# Patient Record
Sex: Male | Born: 2000 | Race: White | Hispanic: No | Marital: Single | Smoking: Current every day smoker
Health system: Southern US, Community
[De-identification: ages and names within clinical notes are randomized; demographics above are authoritative.]

## PROBLEM LIST (undated history)

## (undated) DIAGNOSIS — F32A Depression, unspecified: Secondary | ICD-10-CM

## (undated) DIAGNOSIS — F419 Anxiety disorder, unspecified: Secondary | ICD-10-CM

## (undated) DIAGNOSIS — F329 Major depressive disorder, single episode, unspecified: Secondary | ICD-10-CM

## (undated) DIAGNOSIS — F988 Other specified behavioral and emotional disorders with onset usually occurring in childhood and adolescence: Secondary | ICD-10-CM

## (undated) DIAGNOSIS — L0291 Cutaneous abscess, unspecified: Secondary | ICD-10-CM

## (undated) DIAGNOSIS — Z8489 Family history of other specified conditions: Secondary | ICD-10-CM

## (undated) HISTORY — PX: NO PAST SURGERIES: SHX2092

## (undated) HISTORY — DX: Cutaneous abscess, unspecified: L02.91

## (undated) HISTORY — DX: Anxiety disorder, unspecified: F41.9

## (undated) HISTORY — PX: MOUTH SURGERY: SHX715

## (undated) HISTORY — DX: Other specified behavioral and emotional disorders with onset usually occurring in childhood and adolescence: F98.8

## (undated) HISTORY — PX: TYMPANOSTOMY TUBE PLACEMENT: SHX32

## (undated) HISTORY — DX: Major depressive disorder, single episode, unspecified: F32.9

## (undated) HISTORY — DX: Depression, unspecified: F32.A

---
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
## 2019-01-19 ENCOUNTER — Ambulatory Visit (HOSPITAL_COMMUNITY): Payer: 59 | Admitting: Psychiatry

## 2019-01-19 ENCOUNTER — Other Ambulatory Visit: Payer: Self-pay

## 2019-01-25 ENCOUNTER — Telehealth (HOSPITAL_COMMUNITY): Payer: Self-pay

## 2019-01-25 NOTE — Telephone Encounter (Signed)
Patient called with parents on the phone, he is thinking that his Prozac may need to be increased. Patient is having some anxiety, he is taking the Vistaril on a more regular basis and his parents want to know if increasing the Prozac may help as well.

## 2019-01-26 NOTE — Telephone Encounter (Signed)
I tried calling Essa but there was no asnwer and I could not leave a voice message. Try to schedule him an appointment with me in the next week or 2.

## 2019-02-02 ENCOUNTER — Other Ambulatory Visit: Payer: Self-pay

## 2019-02-02 ENCOUNTER — Encounter (HOSPITAL_COMMUNITY): Payer: Self-pay | Admitting: Psychiatry

## 2019-02-02 ENCOUNTER — Ambulatory Visit (INDEPENDENT_AMBULATORY_CARE_PROVIDER_SITE_OTHER): Payer: 59 | Admitting: Psychiatry

## 2019-02-02 DIAGNOSIS — F411 Generalized anxiety disorder: Secondary | ICD-10-CM

## 2019-02-02 DIAGNOSIS — F324 Major depressive disorder, single episode, in partial remission: Secondary | ICD-10-CM | POA: Diagnosis not present

## 2019-02-02 MED ORDER — FLUOXETINE HCL 40 MG PO CAPS: 40.0000 mg | ORAL_CAPSULE | Freq: Every day | ORAL | 1 refills | Status: DC

## 2019-02-02 MED ORDER — HYDROXYZINE HCL 10 MG PO TABS: 20.0000 mg | ORAL_TABLET | Freq: Three times a day (TID) | ORAL | 1 refills | Status: DC | PRN

## 2019-02-02 NOTE — Progress Notes (Signed)
Virtual Visit via Video Note  I connected with Ian Lamb on 02/02/19 at  1:45 PM EDT by a video enabled telemedicine application and verified that I am speaking with the correct person using two identifiers.  Location: Patient: home Provider: office   I discussed the limitations of evaluation and management by telemedicine and the availability of in person appointments. The patient expressed understanding and agreed to proceed.  History of Present Illness: "I have been stressed out". He has had 2 relatives pass recently. He has been more angry and irritable lately. He can hold it in sometimes but other times he will snap at people. He is depressed and anxious. Ian Lamb describes a lot of headaches and other physical symptoms. The depression will come on for several days at a time, thru out the month. He will smoke marijuana at night to sleep. He stopped for the last 3 days but is not sleeping well. Luman is taking Vistaril TID and it helps with his anxiety a little. Vistaril no longer seems as effective. Iver feels guilty about things he shouldn't. He is crying a lot over the death of her his grandparents several times a week. Ian Lamb denies SI/HI. He does have some passive thoughts of death. Ian Lamb states he would never kill himself because he does not to hurt anyone.   The Prozac works but seems to wear off by noon. He wants to increase the dose and hopes it will last all day. He is a lot more relaxed and it gives him relief and makes him feel more secure and more like himself when he takes the Prozac.    Observations/Objective:  There were no vitals taken for this visit.There is no height or weight on file to calculate BMI.  General Appearance: Casual and Neat  Eye Contact:  Fair  Speech:  Clear and Coherent and Normal Rate  Volume:  Normal  Mood:  Anxious and Depressed  Affect:  Congruent  Thought Process:  Goal Directed and Descriptions of Associations: Intact  Orientation:  Full (Time,  Place, and Person)  Thought Content:  Logical  Suicidal Thoughts:  No  Homicidal Thoughts:  No  Memory:  Immediate;   Good Recent;   Good  Judgement:  Fair  Insight:  Fair  Psychomotor Activity:  Normal  Concentration:  Concentration: Fair  Recall:  Ian Lamb of Knowledge:  Good  Language:  Good  Akathisia:  No  Handed:  Right  AIMS (if indicated):     Assets:  Communication Skills Desire for Improvement Financial Resources/Insurance Housing Leisure Time [place] Talents/Skills Transportation Vocational/Educational  ADL's:  Intact  Cognition:  WNL  Sleep:        Assessment and Plan: MDD- recurrent, moderate; GAD; Cannabis abuse; ADHD- inattentive type   Increase Prozac 40mg  po qD  Increase Vistaril 20mg  po TID prn anxiety  Follow Up Instructions: In 6-8 weeks or sooner if needed   I discussed the assessment and treatment plan with the patient. The patient was provided an opportunity to ask questions and all were answered. The patient agreed with the plan and demonstrated an understanding of the instructions.   The patient was advised to call back or seek an in-person evaluation if the symptoms worsen or if the condition fails to improve as anticipated.  I provided 20 minutes of non-face-to-face time during this encounter.   [name], MD

## 2019-03-16 ENCOUNTER — Encounter: Payer: Self-pay | Admitting: Family Medicine

## 2019-03-20 ENCOUNTER — Other Ambulatory Visit: Payer: Self-pay | Admitting: Family Medicine

## 2019-03-20 ENCOUNTER — Telehealth (HOSPITAL_COMMUNITY): Payer: Self-pay | Admitting: *Deleted

## 2019-03-20 DIAGNOSIS — F419 Anxiety disorder, unspecified: Secondary | ICD-10-CM

## 2019-03-20 NOTE — Telephone Encounter (Signed)
Pt mother called regarding increasing Prozac due to increased anger and anxiety. Please review and advise pt has an appointment on 03/29/21.

## 2019-03-23 ENCOUNTER — Telehealth: Payer: Self-pay

## 2019-03-23 NOTE — Telephone Encounter (Signed)
Patient's mom, Ian Lamb, left message on triage line stating that patient needs to get a new counselor and psychiatrist that work close together. She is aware that patient sent Dr Damita Dunnings a message a few days ago about this (referral was put in already) [name] states patient has been seen [name] for 2 1/2 year and [name], psychiatrist. Patient does not have a good relationship with the psychiatrist. Mom states patient has had 2 deaths in the past month to deal with and having anxiety. He is taking his medication but not working as well. Mom just wanted to let us know.

## 2019-03-23 NOTE — Telephone Encounter (Signed)
Noted.  I'll route for input on the referral.  Rosaria Ferries- what are his options for referral?

## 2019-03-30 ENCOUNTER — Other Ambulatory Visit: Payer: Self-pay

## 2019-03-30 ENCOUNTER — Ambulatory Visit (INDEPENDENT_AMBULATORY_CARE_PROVIDER_SITE_OTHER): Payer: 59 | Admitting: Psychiatry

## 2019-03-30 ENCOUNTER — Encounter (HOSPITAL_COMMUNITY): Payer: Self-pay | Admitting: Psychiatry

## 2019-03-30 DIAGNOSIS — F324 Major depressive disorder, single episode, in partial remission: Secondary | ICD-10-CM | POA: Diagnosis not present

## 2019-03-30 DIAGNOSIS — F411 Generalized anxiety disorder: Secondary | ICD-10-CM | POA: Diagnosis not present

## 2019-03-30 DIAGNOSIS — F1210 Cannabis abuse, uncomplicated: Secondary | ICD-10-CM | POA: Diagnosis not present

## 2019-03-30 DIAGNOSIS — F900 Attention-deficit hyperactivity disorder, predominantly inattentive type: Secondary | ICD-10-CM

## 2019-03-30 MED ORDER — FLUOXETINE HCL 20 MG PO CAPS: 60.0000 mg | ORAL_CAPSULE | Freq: Every day | ORAL | 2 refills | Status: DC

## 2019-03-30 MED ORDER — CLONIDINE HCL 0.1 MG PO TABS: 0.1000 mg | ORAL_TABLET | Freq: Three times a day (TID) | ORAL | 2 refills | Status: DC | PRN

## 2019-03-30 NOTE — Progress Notes (Signed)
Virtual Visit via Telephone Note  I connected with Ian Lamb  on 03/30/19 at  2:00 PM EST by telephone and verified that I am speaking with the correct person using two identifiers.  Location: Patient: home Provider: office   I discussed the limitations, risks, security and privacy concerns of performing an evaluation and management service by telephone and the availability of in person appointments. I also discussed with the patient that there may be a patient responsible charge related to this service. The patient expressed understanding and agreed to proceed.   History of Present Illness: Pt states he is not able to share all his thoughts and feelings so he has asked his parents to join.  Mom states he has lost 2 grandmother's in the last 2 months who he was very close to. He is not eating well due to upset GI. This is typical for when he is anxious. He feels bad about himself. He is easily overwhelmed and very sensitive to other's feelings. He is being tested for ADD in late December. He has not taken any ADD meds since the 10th grade. Ian Lamb is often forgetful and misses meds. He thinks that medicinal marijuana. He has told his mom that marijuana is the only thing that can calm him down. Ian Lamb does not like to drink a lot. Ian Lamb is aware that he is depressed and does not like feeling this way. He is more anxious lately.  His dad shares that when Ian Lamb is upset he gets very angry. They are not able to communicate with him until he calms downs. His dad thinks that the lack of treatment of ADHD is contributing.   Ian Lamb thinks the Prozac is not very effective but later he states it does help and he has been taking it daily lately. When he gets upset he takes the Vistaril it helps for about an hour and then goes away. He tries to take it about 2-3 times a day. He often forgets to take it but would get upset and angry with himself because of it. Ian Lamb has been feeling more anxious lately. He has  a lot of issues with self confidence. On the inside he is always thinking bad thoughts about himself. When anxious he will stutter. Ian Lamb denies SI/HI. He does have thoughts about death but states he always feels bad after. He has no desire, plan or intent to hurt or kill himself. Sleep is fair for the last 2 days. Prior he was waking up at 3am and having a lot of anxiety. He is unable to go back to sleep. Ian Lamb tries to stay active to keep himself distracted. Prozac does not help with is anxiety. Ian Lamb does want to get treatment for ADHD now and has an appointment with a clinic at the end of December. He continues to smoke marijuana daily because it helps him calm down and sleep. He does it alone and only for sleeping.     Observations/Objective: I spoke with Ian Lamb [name] on the phone.  Pt was calm, pleasant and cooperative.  Pt was engaged in the conversation and answered questions appropriately.  Speech was clear and coherent with normal rate, tone and volume.  Mood is depressed and anxious, affect is congruent. He was on/off tearful. Thought processes are coherent, goal oriented and intact.  Thought content is logical.  Pt denies SI/HI.   Pt denies auditory and visual hallucinations and did not appear to be responding to internal stimuli.  Memory and  concentration are good.  Fund of knowledge and use of language are average.  Insight and judgment are fair.  I am unable to comment on psychomotor activity, general appearance, hygiene, or eye contact as I was unable to physically see the patient on the phone.  Vital signs not available since interview conducted virtually.     Assessment and Plan: MDD-recurrent, moderate; GAD; Cannabis abuse; ADHD-inattentive type  Status of current symptoms: worsening depression and anxiety  Increase Prozac 60mg  po qD  D/c Vistaril   Start trial of Clonidine 0.1mg  po TID prn anxiety   Encouraged to look treatment of ADHD  Follow Up Instructions: In 6 weeks or  sooner if needed   I discussed the assessment and treatment plan with the patient. The patient was provided an opportunity to ask questions and all were answered. The patient agreed with the plan and demonstrated an understanding of the instructions.   The patient was advised to call back or seek an in-person evaluation if the symptoms worsen or if the condition fails to improve as anticipated.  I provided 40 minutes of non-face-to-face time during this encounter.   Ian Cradle, MD

## 2019-04-24 ENCOUNTER — Encounter: Payer: Self-pay | Admitting: Family Medicine

## 2019-04-24 NOTE — Telephone Encounter (Signed)
pts mom said pt saw his father in [place] 8 days ago and pts father tested positive 04/23/18. Pt does not have covid symptoms and no travel. Scheduled virtual visit on 04/24/18 at 8:45. Will have vitals ready. UC & ED precautions given and pts mom voiced understanding. Pt mom does not have doctor and pts mom is not sure who pts stepfathers'doctor is but he will ck with his doctor for further instructions. FYI to Dr Para March.

## 2019-04-25 ENCOUNTER — Ambulatory Visit (INDEPENDENT_AMBULATORY_CARE_PROVIDER_SITE_OTHER): Payer: Managed Care, Other (non HMO) | Admitting: Family Medicine

## 2019-04-25 ENCOUNTER — Other Ambulatory Visit: Payer: Self-pay

## 2019-04-25 DIAGNOSIS — Z9189 Other specified personal risk factors, not elsewhere classified: Secondary | ICD-10-CM | POA: Diagnosis not present

## 2019-04-25 NOTE — Telephone Encounter (Signed)
See OV note.  Thanks.  

## 2019-04-25 NOTE — Progress Notes (Signed)
Interactive audio and video telecommunications were attempted between this provider and patient, however failed, due to patient having technical difficulties OR patient did not have access to video capability.  We continued and completed visit with audio only.   Virtual Visit via Telephone Note  I connected with patient on 04/25/19  at 8:55 AM  by telephone and verified that I am speaking with the correct person using two identifiers.  Location of patient: home  Location of MD: [facility] Name of referring provider (if blank then none associated): Names per persons and role in encounter:  MD: Ferd Hibbs, Patient: name listed above.    I discussed the limitations, risks, security and privacy concerns of performing an evaluation and management service by telephone and the availability of in person appointments. I also discussed with the patient that there may be a patient responsible charge related to this service. The patient expressed understanding and agreed to proceed.  CC: covid exposure.    History of Present Illness: Father recently tested positive after having a fever.  His father tested positive yesterday, with a same day test.  Father's sx started yesterday.  Pt's last exposure to his father was 9 days ago.    Pt has no sx now, no FCNAVD.  No taste or smell changes. He still feels well.     Observations/Objective: nad Speech wnl.    Assessment and Plan: he feels well, discussed options.  It may be the case where his father contracted covid after last exposure to patient.  Would defer testing at this point since patient is w/o sx and nearing the 14 day limit from last possible exposure and with 2-3 day testing turn-around time likely.  Pt to monitor sx for now and update me as needed.  Routine cautions d/w pt.  He agrees.    Follow Up Instructions: see above.    I discussed the assessment and treatment plan with the patient. The patient was provided an opportunity to ask  questions and all were answered. The patient agreed with the plan and demonstrated an understanding of the instructions.   The patient was advised to call back or seek an in-person evaluation if the symptoms worsen or if the condition fails to improve as anticipated.  I provided 20 minutes of non-face-to-face time during this encounter.   Crawford Givens, MD

## 2019-04-25 NOTE — Assessment & Plan Note (Signed)
He feels well, discussed options.  It may be the case where his father contracted covid after last exposure to patient.  Would defer testing at this point since patient is w/o sx and nearing the 14 day limit from last possible exposure and with 2-3 day testing turn-around time likely.  Pt to monitor sx for now and update me as needed.  Routine cautions d/w pt.  He agrees.

## 2019-05-04 ENCOUNTER — Ambulatory Visit (HOSPITAL_COMMUNITY): Payer: 59 | Admitting: Psychiatry

## 2019-05-18 ENCOUNTER — Ambulatory Visit (HOSPITAL_COMMUNITY): Payer: 59 | Admitting: Psychiatry

## 2019-05-18 ENCOUNTER — Other Ambulatory Visit: Payer: Self-pay

## 2019-05-18 ENCOUNTER — Encounter (HOSPITAL_COMMUNITY): Payer: Self-pay | Admitting: Psychiatry

## 2019-05-18 NOTE — Progress Notes (Unsigned)
  Virtual Visit via Telephone Note  I connected with Ian Lamb  on 05/18/19 at  3:30 PM EST by telephone and verified that I am speaking with the correct person using two identifiers.  Location: Patient: home Provider: office   I discussed the limitations, risks, security and privacy concerns of performing an evaluation and management service by telephone and the availability of in person appointments. I also discussed with the patient that there may be a patient responsible charge related to this service. The patient expressed understanding and agreed to proceed.   History of Present Illness: Ian Lamb asked that I speak with his mom first. Mom shared that the increase in Prozac and Clonidine really helped. He had a good month. Over the last 2 weeks he has had some bad days. Those days he thinks about his past mistakes and gets fixated on it. When he gets this anxious he feels he needs something help. They had an appointment with the ADHD specialist and want to give a good hx of his past med trials.  Ian Lamb states the last 2 weeks have been more difficult. He is not sleeping well. He will have racing thoughts and fall asleep takes many hours then he will only sleep for 2-3 hrs. He is napping during the day due to excess fatigue. The Clonidine is no longer effective. Ian Lamb is depressed and is having a lot of negative self talk. He feels alone even when with other people when he is stressed. Being around others does help a lot when he is just feeling down. Ian Lamb misses his deceased family a lot. Ian Lamb gets really mad at himself but denies SIB. He denies SI/HI.  He has a history of seasonal affective disorder. In the past being active and busy helps a lot.     Observations/Objective: I spoke with Ian Lamb on the phone.  Pt was calm, pleasant and cooperative.  Pt was engaged in the conversation and answered questions appropriately.  Speech was clear and coherent with normal rate, tone and  volume.  Mood is ***depressed and anxious, affect is congruent. Thought processes are coherent, goal oriented and intact.  Thought content is with ruminations***.  Pt denies SI/HI.   Pt denies auditory and visual hallucinations and did not appear to be responding to internal stimuli.  Memory and concentration are good.  Fund of knowledge and use of language are average.  Insight and judgment are fair.  I am unable to comment on psychomotor activity, general appearance, hygiene, or eye contact as I was unable to physically see the patient on the phone.  Vital signs not available since interview conducted virtually.     Assessment and Plan: MDD-recurrent, moderate; GAD; Cannabis abuse; ADHD- inattentive type  Status of current symptoms: worsening depression and anxiety  Increase Prozac 80mg  po qD  D/c Clonidine   Start trial of Propranolol 10mg  po BID prn anxiety  Follow Up Instructions: In 4 weeks or sooner if needed   I discussed the assessment and treatment plan with the patient. The patient was provided an opportunity to ask questions and all were answered. The patient agreed with the plan and demonstrated an understanding of the instructions.   The patient was advised to call back or seek an in-person evaluation if the symptoms worsen or if the condition fails to improve as anticipated.  I provided *** minutes of non-face-to-face time during this encounter.   , MD

## 2019-05-22 ENCOUNTER — Telehealth (HOSPITAL_COMMUNITY): Payer: Self-pay | Admitting: *Deleted

## 2019-05-22 NOTE — Telephone Encounter (Signed)
Writer received message from pt mother stating that pt has not been able to get his increased Prozac dose, 80mg , nor able to begin the Inderal as RX's have not been sent to pharmacy. I don't see increase dose or initiation of Inderal reflected in last visit note. Please review and advise.

## 2019-05-25 ENCOUNTER — Other Ambulatory Visit (HOSPITAL_COMMUNITY): Payer: Self-pay | Admitting: Psychiatry

## 2019-05-25 ENCOUNTER — Telehealth (HOSPITAL_COMMUNITY): Payer: Self-pay | Admitting: *Deleted

## 2019-05-25 DIAGNOSIS — F411 Generalized anxiety disorder: Secondary | ICD-10-CM

## 2019-05-25 DIAGNOSIS — F324 Major depressive disorder, single episode, in partial remission: Secondary | ICD-10-CM

## 2019-05-25 MED ORDER — PROPRANOLOL HCL 10 MG PO TABS: 10.0000 mg | ORAL_TABLET | Freq: Two times a day (BID) | ORAL | 2 refills | Status: DC | PRN

## 2019-05-25 MED ORDER — FLUOXETINE HCL 40 MG PO CAPS: 80.0000 mg | ORAL_CAPSULE | Freq: Every day | ORAL | 0 refills | Status: DC

## 2019-05-25 NOTE — Telephone Encounter (Signed)
Pt mother called second time requesting refill of pt med Prozac 20, 3 tabs qd. Last written 03/30/19. Pt has an upcoming appointment on 06/08/19. Please review.

## 2019-05-30 ENCOUNTER — Telehealth (HOSPITAL_COMMUNITY): Payer: Self-pay | Admitting: *Deleted

## 2019-05-30 ENCOUNTER — Encounter: Payer: Self-pay | Admitting: Family Medicine

## 2019-05-30 NOTE — Telephone Encounter (Signed)
Nurse received message from mother concerned that pt has been having nosebleeds which she feels is related to increasing Prozac to 80mg , and adding Inderal 10mg  bid. Looks like mother reached to PCP also. She is concerned and frustrated regarding medication regime. Please review.

## 2019-06-08 ENCOUNTER — Encounter (HOSPITAL_COMMUNITY): Payer: Self-pay | Admitting: Psychiatry

## 2019-06-08 ENCOUNTER — Other Ambulatory Visit: Payer: Self-pay

## 2019-06-08 ENCOUNTER — Ambulatory Visit (INDEPENDENT_AMBULATORY_CARE_PROVIDER_SITE_OTHER): Payer: 59 | Admitting: Psychiatry

## 2019-06-08 DIAGNOSIS — F900 Attention-deficit hyperactivity disorder, predominantly inattentive type: Secondary | ICD-10-CM | POA: Diagnosis not present

## 2019-06-08 DIAGNOSIS — F411 Generalized anxiety disorder: Secondary | ICD-10-CM

## 2019-06-08 DIAGNOSIS — F324 Major depressive disorder, single episode, in partial remission: Secondary | ICD-10-CM

## 2019-06-08 MED ORDER — LISDEXAMFETAMINE DIMESYLATE 30 MG PO CAPS: 30.0000 mg | ORAL_CAPSULE | Freq: Every day | ORAL | 0 refills | Status: DC

## 2019-06-08 NOTE — Progress Notes (Signed)
  Virtual Visit via Telephone Note  I connected with Ian Lamb  on 06/08/19 at  8:45 AM EST by telephone and verified that I am speaking with the correct person using two identifiers.  Location: Patient: home Provider: office   I discussed the limitations, risks, security and privacy concerns of performing an evaluation and management service by telephone and the availability of in person appointments. I also discussed with the patient that there may be Ian patient responsible charge related to this service. The patient expressed understanding and agreed to proceed.   History of Present Illness: Mom and dad state that this combination of meds seems to be helping. They are noticing some improvement. Ian Lamb had an appointment with [facility] regarding his ADHD. While there he received Ian script Focalin XR 10mg . On the 3rd day he had Ian bad reaction. He had Ian beer that day and it may have contributed. Since then he has not taken the Focalin and does not want to restart it.   Ian Lamb reports his anxiety is better. He takes  the Propranolol at least once Ian day. He feels calmer than one month ago. Ian Lamb feels hopeful that getting on ADHD meds will also help his anxiety improve. He is interested in trying something else. He notes that marijuana and alcohol do increase his anxiety. He is now drinking 3-4 beers Ian few times Ian week. He stopped smoking marijuana 12 days ago. His depression has improved and is more manageable. His making an effort to reach out for support when he is really feeling down. He denies SI/HI.     Observations/Objective: I spoke with Ian Lamb on the phone.  Pt was calm, pleasant and cooperative.  Pt was engaged in the conversation and answered questions appropriately.  Speech was clear and coherent with normal rate, tone and volume.  Mood is depressed and anxious, affect is congruent but calmer than before. Thought processes are coherent, goal oriented and  intact.  Thought content is with ruminations.  Pt denies SI/HI.   Pt denies auditory and visual hallucinations and did not appear to be responding to internal stimuli.  Memory and concentration are good.  Fund of knowledge and use of language are average.  Insight and judgment are fair.  I am unable to comment on psychomotor activity, general appearance, hygiene, or eye contact as I was unable to physically see the patient on the phone.  Vital signs not available since interview conducted virtually.    I reviewed the information below on 06/08/19 and have updated it Assessment and Plan: MDD-recurrent, moderate; GAD; Cannabis abuse; ADHD- inattentive type   Status of current symptoms: improvement in depression and anxiety; ongoing ADHD symptoms   Prozac 80mg  po qD  Propranolol 10mg  po BID prn anxiety  Start trial of Vyvanse 30mg  po qD for ADHD  Follow Up Instructions: In 3 weeks or sooner if needed   I discussed the assessment and treatment plan with the patient. The patient was provided an opportunity to ask questions and all were answered. The patient agreed with the plan and demonstrated an understanding of the instructions.   The patient was advised to call back or seek an in-person evaluation if the symptoms worsen or if the condition fails to improve as anticipated.  I provided 20 minutes of non-face-to-face time during this encounter.   06/10/19, MD

## 2019-06-29 ENCOUNTER — Other Ambulatory Visit: Payer: Self-pay

## 2019-06-29 ENCOUNTER — Ambulatory Visit (HOSPITAL_COMMUNITY): Payer: 59 | Admitting: Psychiatry

## 2019-06-29 NOTE — Progress Notes (Unsigned)
Taino shares he is ok. He is taking his meds daily as prescribed. Keelon feels his anxiety is ongoing but manageable with prozac and propranolol. He is smoking marijuana 3x/day but plans to stop soon. Acie is dealing with his depression. He wants to be doing something. He is taking classes starting in August. Kourosh denies SI/HI. He gets upset with himself that he has to take meds.    Mom states Nahmir takes his prn meds as needed. His sleep schedule is off. Hakim has told his mom that he has bad days but they are not as frequently. He is looking for a new therapist. He has decided not to start the Vyvanse until school starts. Enis is smoking marijuana 3x/day   D/C clonidine Continue prozac and propranolol  F/u. 4/22 @ 2.45p

## 2019-07-24 ENCOUNTER — Encounter: Payer: Self-pay | Admitting: Family Medicine

## 2019-07-24 NOTE — Telephone Encounter (Signed)
Patient's mom left a voicemail requesting a response to the my chart message regarding a rash.

## 2019-07-26 ENCOUNTER — Other Ambulatory Visit: Payer: Self-pay | Admitting: Family Medicine

## 2019-07-26 MED ORDER — PREDNISONE 10 MG PO TABS: ORAL_TABLET | ORAL | 0 refills | Status: DC

## 2019-08-10 ENCOUNTER — Other Ambulatory Visit: Payer: Self-pay

## 2019-08-10 ENCOUNTER — Ambulatory Visit (HOSPITAL_COMMUNITY): Payer: 59 | Admitting: Psychiatry

## 2019-08-10 NOTE — Telephone Encounter (Signed)
I called the patient at our scheduled appointment time. There was no answer. I left a voice message asking for patient to call clinic. I was unable to speak with the patient today.

## 2019-09-07 ENCOUNTER — Telehealth (INDEPENDENT_AMBULATORY_CARE_PROVIDER_SITE_OTHER): Payer: 59 | Admitting: Psychiatry

## 2019-09-07 ENCOUNTER — Encounter (HOSPITAL_COMMUNITY): Payer: Self-pay | Admitting: Psychiatry

## 2019-09-07 ENCOUNTER — Other Ambulatory Visit: Payer: Self-pay

## 2019-09-07 DIAGNOSIS — F900 Attention-deficit hyperactivity disorder, predominantly inattentive type: Secondary | ICD-10-CM

## 2019-09-07 DIAGNOSIS — F324 Major depressive disorder, single episode, in partial remission: Secondary | ICD-10-CM | POA: Diagnosis not present

## 2019-09-07 DIAGNOSIS — F411 Generalized anxiety disorder: Secondary | ICD-10-CM

## 2019-09-07 MED ORDER — FLUOXETINE HCL 40 MG PO CAPS: 80.0000 mg | ORAL_CAPSULE | Freq: Every day | ORAL | 0 refills | Status: DC

## 2019-09-07 MED ORDER — METHYLPHENIDATE HCL ER (OSM) 18 MG PO TBCR: 18.0000 mg | EXTENDED_RELEASE_TABLET | Freq: Every day | ORAL | 0 refills | Status: DC

## 2019-09-07 MED ORDER — PROPRANOLOL HCL 10 MG PO TABS: 10.0000 mg | ORAL_TABLET | Freq: Three times a day (TID) | ORAL | 2 refills | Status: DC | PRN

## 2019-09-07 NOTE — Progress Notes (Signed)
Virtual Visit via Telephone Note  I connected with Ian Lamb on 09/07/19 at  1:15 PM EDT by telephone and verified that I am speaking with the correct person using two identifiers.  Location: Patient: home Provider: office   I discussed the limitations, risks, security and privacy concerns of performing an evaluation and management service by telephone and the availability of in person appointments. I also discussed with the patient that there may be a patient responsible charge related to this service. The patient expressed understanding and agreed to proceed.   History of Present Illness: "I am feeling much better". Ian Lamb feels better. The sunny weather is helping. He is still searching therapist. His depression is ongoing and notices it more when alone or when stressed. His anxiety comes and goes and the Propranolol helps. Ian Lamb is planning on taking 1 summer classe and wants to restart treatment with stimulants. He has poor self- esteem.  Ian Lamb is smoking marijuana 2x/day. The total amount is less than before. He uses it to relax. He denies SI/HI.    Observations/Objective:  General Appearance: unable to assess  Eye Contact:  unable to assess  Speech:  Clear and Coherent and Normal Rate  Volume:  Normal  Mood:  Anxious and Depressed  Affect:  Congruent  Thought Process:  Coherent and Descriptions of Associations: Intact  Orientation:  Full (Time, Place, and Person)  Thought Content:  Logical  Suicidal Thoughts:  No  Homicidal Thoughts:  No  Memory:  Immediate;   Good  Judgement:  Poor  Insight:  Shallow  Psychomotor Activity: unable to assess  Concentration:  Concentration: Fair  Recall:  [employer] of Knowledge:  Good  Language:  Good  Akathisia:  unable to assess  Handed:  Right  AIMS (if indicated):     Assets:  Communication Skills Desire for Improvement Financial Resources/Insurance Housing Social Support Talents/Skills Transportation Vocational/Educational   ADL's:  unable to assess  Cognition:  WNL  Sleep:        I reviewed the information below on 09/07/19 and have updated it Assessment and Plan:  MDD-recurrent, moderate; GAD; Cannabis abuse; ADHD- inattentive type     Prozac 80mg  po qD   Propranolol 10mg  po BID prn anxiety   Start trial of Vyvanse 30mg  po qD for ADHD    Encouraged to seek therapy  Follow Up Instructions: In 2 months or sooner if needed   I discussed the assessment and treatment plan with the patient. The patient was provided an opportunity to ask questions and all were answered. The patient agreed with the plan and demonstrated an understanding of the instructions.   The patient was advised to call back or seek an in-person evaluation if the symptoms worsen or if the condition fails to improve as anticipated.  I provided 25 minutes of non-face-to-face time during this encounter.   , MD

## 2019-11-07 ENCOUNTER — Encounter: Payer: Self-pay | Admitting: Family Medicine

## 2019-11-16 ENCOUNTER — Telehealth (HOSPITAL_COMMUNITY): Payer: 59 | Admitting: Psychiatry

## 2019-11-16 ENCOUNTER — Other Ambulatory Visit: Payer: Self-pay

## 2019-11-23 ENCOUNTER — Telehealth (INDEPENDENT_AMBULATORY_CARE_PROVIDER_SITE_OTHER): Payer: 59 | Admitting: Psychiatry

## 2019-11-23 ENCOUNTER — Other Ambulatory Visit: Payer: Self-pay

## 2019-11-23 DIAGNOSIS — F324 Major depressive disorder, single episode, in partial remission: Secondary | ICD-10-CM

## 2019-11-23 DIAGNOSIS — F900 Attention-deficit hyperactivity disorder, predominantly inattentive type: Secondary | ICD-10-CM | POA: Diagnosis not present

## 2019-11-23 DIAGNOSIS — F411 Generalized anxiety disorder: Secondary | ICD-10-CM

## 2019-11-23 MED ORDER — DEXMETHYLPHENIDATE HCL 5 MG PO TABS: 5.0000 mg | ORAL_TABLET | Freq: Two times a day (BID) | ORAL | 0 refills | Status: DC

## 2019-11-23 MED ORDER — PROPRANOLOL HCL 10 MG PO TABS: 10.0000 mg | ORAL_TABLET | Freq: Three times a day (TID) | ORAL | 2 refills | Status: DC | PRN

## 2019-11-23 MED ORDER — FLUOXETINE HCL 40 MG PO CAPS: 80.0000 mg | ORAL_CAPSULE | Freq: Every day | ORAL | 0 refills | Status: DC

## 2019-11-23 NOTE — Progress Notes (Signed)
Virtual Visit via Telephone Note  I connected with Ian Lamb on 11/23/19 at  1:45 PM EDT by telephone and verified that I am speaking with the correct person using two identifiers.  Location: Patient: in car Provider: office   I discussed the limitations, risks, security and privacy concerns of performing an evaluation and management service by telephone and the availability of in person appointments. I also discussed with the patient that there may be a patient responsible charge related to this service. The patient expressed understanding and agreed to proceed.   History of Present Illness: Ian Lamb states he is doing well. He has been busy working at his [employer], visiting friends in [place]. He has finished his summer class. His depression comes and goes. It got bad when his girlfriend left town for travel. It got better but when she comes to visit and not wanting to hang out because she wants to sleep. He is stressed because school is starting soon. [name] is taking his meds daily as prescribed and has only missed 1-2 doses. He has also been taking the Focalin. [name] is more focused and goal oriented.    Observations/Objective:  General Appearance: unable to assess  Eye Contact:  unable to assess  Speech:  Clear and Coherent and Normal Rate  Volume:  Normal  Mood:  Anxious and Depressed  Affect:  Blunt  Thought Process:  Goal Directed, Linear and Descriptions of Associations: Intact  Orientation:  Full (Time, Place, and Person)  Thought Content:  Logical  Suicidal Thoughts:  No  Homicidal Thoughts:  No  Memory:  Immediate;   Good  Judgement:  Good  Insight:  Good  Psychomotor Activity: unable to assess  Concentration:  Concentration: Good  Recall:  Good  Fund of Knowledge:  Good  Language:  Good  Akathisia:  unable to assess  Handed:  Right  AIMS (if indicated):     Assets:  Communication Skills Desire for Improvement Financial  Resources/Insurance Housing Intimacy Resilience Social Support Talents/Skills Transportation Vocational/Educational  ADL's:  unable to assess  Cognition:  WNL  Sleep:         Assessment and Plan: MDD- recurrent, moderate; GAD; ADHD- inattentive type; Cannabis abuse  Prozac 80mg  po qD  Propranolol 10mg  po BID prn anxiety  D/c Vyvanse   Restart Focalin 5mg  po BID   Follow Up Instructions: In 6 weeks or sooner if needed   I discussed the assessment and treatment plan with the patient. The patient was provided an opportunity to ask questions and all were answered. The patient agreed with the plan and demonstrated an understanding of the instructions.   The patient was advised to call back or seek an in-person evaluation if the symptoms worsen or if the condition fails to improve as anticipated.  I provided 15 minutes of non-face-to-face time during this encounter.   , MD

## 2019-12-27 ENCOUNTER — Encounter: Payer: Self-pay | Admitting: Family Medicine

## 2020-01-04 ENCOUNTER — Telehealth (INDEPENDENT_AMBULATORY_CARE_PROVIDER_SITE_OTHER): Payer: 59 | Admitting: Psychiatry

## 2020-01-04 ENCOUNTER — Other Ambulatory Visit: Payer: Self-pay

## 2020-01-04 DIAGNOSIS — F900 Attention-deficit hyperactivity disorder, predominantly inattentive type: Secondary | ICD-10-CM

## 2020-01-04 DIAGNOSIS — F324 Major depressive disorder, single episode, in partial remission: Secondary | ICD-10-CM | POA: Diagnosis not present

## 2020-01-04 DIAGNOSIS — F411 Generalized anxiety disorder: Secondary | ICD-10-CM

## 2020-01-04 MED ORDER — DEXMETHYLPHENIDATE HCL 5 MG PO TABS: 5.0000 mg | ORAL_TABLET | Freq: Two times a day (BID) | ORAL | 0 refills | Status: DC

## 2020-01-04 MED ORDER — FLUOXETINE HCL 40 MG PO CAPS: 80.0000 mg | ORAL_CAPSULE | Freq: Every day | ORAL | 0 refills | Status: DC

## 2020-01-04 MED ORDER — PROPRANOLOL HCL 10 MG PO TABS: 10.0000 mg | ORAL_TABLET | Freq: Three times a day (TID) | ORAL | 0 refills | Status: DC | PRN

## 2020-01-04 NOTE — Progress Notes (Signed)
Virtual Visit via Telephone Note  I connected with Goku A Gawlik IV on 01/04/20 at  2:30 PM EDT by telephone and verified that I am speaking with the correct person using two identifiers.  Location: Patient: at school Provider: office   I discussed the limitations, risks, security and privacy concerns of performing an evaluation and management service by telephone and the availability of in person appointments. I also discussed with the patient that there may be a patient responsible charge related to this service. The patient expressed understanding and agreed to proceed.   History of Present Illness: Jahmad just got out of class. School is going well. Khylin reports that he has episodes of "rage" that happen when really stressed. He gives an example of Thursdays when he has tests as school. After feeling anger then he has a lot of negative self talk. The episodes last for the rest of day. The next day is a little better. He is working with his therapist. Earon doesn't want to be like his dad who had prolonged episodes of rage until he started a medication. Macio takes [place] on days he has classes. He finds it effective. Edilberto is working with his dad to manage his schedule and assignments. He takes Propranolol twice a day and it calms him. His depression is ok except when his girlfriend goes out on weekends without him. In the past his ex-girlfriends cheated on him. He denies SI/HI.    Observations/Objective:  General Appearance: unable to assess  Eye Contact:  unable to assess  Speech:  Clear and Coherent and Normal Rate  Volume:  Normal  Mood:  Anxious  Affect:  Congruent  Thought Process:  Goal Directed, Linear and Descriptions of Associations: Intact  Orientation:  Full (Time, Place, and Person)  Thought Content:  Logical  Suicidal Thoughts:  No  Homicidal Thoughts:  No  Memory:  Immediate;   Good  Judgement:  Good  Insight:  Good  Psychomotor Activity: unable to assess  Concentration:   Concentration: Good  Recall:  Good  Fund of Knowledge:  Good  Language:  Good  Akathisia:  unable to assess  Handed:  Right  AIMS (if indicated):     Assets:  Communication Skills Desire for Improvement Financial Resources/Insurance Housing Leisure Time Physical Health Resilience Social Support Talents/Skills Transportation Vocational/Educational  ADL's:  unable to assess  Cognition:  WNL  Sleep:         Assessment and Plan:  MDD- recurrent, moderate; GAD; ADHD- inattentive type  Prozac 80mg  po qD  Increase Propranolol 10mg  po TID prn anxiety  Focalin 5mg  BID   Follow Up Instructions: In 2-3 months or sooner if needed   I discussed the assessment and treatment plan with the patient. The patient was provided an opportunity to ask questions and all were answered. The patient agreed with the plan and demonstrated an understanding of the instructions.   The patient was advised to call back or seek an in-person evaluation if the symptoms worsen or if the condition fails to improve as anticipated.  I provided 15 minutes of non-face-to-face time during this encounter.   , MD

## 2020-02-06 ENCOUNTER — Ambulatory Visit (INDEPENDENT_AMBULATORY_CARE_PROVIDER_SITE_OTHER): Payer: Managed Care, Other (non HMO) | Admitting: Family Medicine

## 2020-02-06 ENCOUNTER — Encounter: Payer: Self-pay | Admitting: Family Medicine

## 2020-02-06 ENCOUNTER — Other Ambulatory Visit: Payer: Self-pay

## 2020-02-06 VITALS — BP 116/62 | HR 81 | Temp 97.3°F | Ht 71.0 in | Wt 181.3 lb

## 2020-02-06 DIAGNOSIS — Z23 Encounter for immunization: Secondary | ICD-10-CM

## 2020-02-06 DIAGNOSIS — B079 Viral wart, unspecified: Secondary | ICD-10-CM | POA: Diagnosis not present

## 2020-02-06 NOTE — Patient Instructions (Signed)
The spots should blister and then heal.  Update me as needed.  If needed, you can use the OTC wart pads on any residual after they have healed.   Take care.  Glad to see you.

## 2020-02-06 NOTE — Progress Notes (Signed)
This visit occurred during the SARS-CoV-2 public health emergency.  Safety protocols were in place, including screening questions prior to the visit, additional usage of staff PPE, and extensive cleaning of exam room while observing appropriate contact time as indicated for disinfecting solutions.  He had COVID vaccine.  Wart removal.  4 on the R 5th finger.  3 on the R 3rd, on the R 2nd.  L dorsal hand x1, 1 on L 3rd and 5th finger each.   Desires removal.  Discussed options.  He consented to treatment with liquid nitrogen.  Meds, vitals, and allergies reviewed.   ROS: Per HPI unless specifically indicated in ROS section   nad Warty lesions noted on the bilateral hands and the location as described above.  All frozen x3 with liquid nitrogen after getting consent from patient.  All tolerated well without complication.

## 2020-02-07 NOTE — Assessment & Plan Note (Signed)
All treated with 3 cycles of freeze/thaw with liquid nitrogen after getting consent.  Tolerated well.  Routine cautions given to patient.  He may need follow-up treatment.  See after visit summary.  He will update me as needed.

## 2020-02-16 ENCOUNTER — Telehealth (HOSPITAL_COMMUNITY): Payer: Self-pay | Admitting: *Deleted

## 2020-02-16 NOTE — Telephone Encounter (Signed)
Pt mother called requesting refill of pt Focalin. Pt has an appointment on 03/28/20. Please review.

## 2020-02-20 ENCOUNTER — Other Ambulatory Visit (HOSPITAL_COMMUNITY): Payer: Self-pay | Admitting: Psychiatry

## 2020-02-20 ENCOUNTER — Telehealth (HOSPITAL_COMMUNITY): Payer: Self-pay | Admitting: *Deleted

## 2020-02-20 DIAGNOSIS — F900 Attention-deficit hyperactivity disorder, predominantly inattentive type: Secondary | ICD-10-CM

## 2020-02-20 MED ORDER — DEXMETHYLPHENIDATE HCL 5 MG PO TABS: 5.0000 mg | ORAL_TABLET | Freq: Two times a day (BID) | ORAL | 0 refills | Status: DC

## 2020-02-20 NOTE — Telephone Encounter (Signed)
Done

## 2020-02-20 NOTE — Telephone Encounter (Signed)
Pt mother called again for refill of pt Focalin. Pt has an upcoming appointment on 03/28/20. Please review.

## 2020-03-06 ENCOUNTER — Encounter: Payer: Self-pay | Admitting: Family Medicine

## 2020-03-06 ENCOUNTER — Other Ambulatory Visit: Payer: Self-pay | Admitting: Family Medicine

## 2020-03-06 MED ORDER — MUPIROCIN CALCIUM 2 % EX CREA: 1.0000 "application " | TOPICAL_CREAM | Freq: Two times a day (BID) | CUTANEOUS | 0 refills | Status: DC

## 2020-03-28 ENCOUNTER — Other Ambulatory Visit: Payer: Self-pay

## 2020-03-28 ENCOUNTER — Telehealth (HOSPITAL_COMMUNITY): Payer: 59 | Admitting: Psychiatry

## 2020-03-28 DIAGNOSIS — F324 Major depressive disorder, single episode, in partial remission: Secondary | ICD-10-CM

## 2020-03-28 DIAGNOSIS — F900 Attention-deficit hyperactivity disorder, predominantly inattentive type: Secondary | ICD-10-CM

## 2020-03-28 DIAGNOSIS — F411 Generalized anxiety disorder: Secondary | ICD-10-CM

## 2020-03-28 MED ORDER — VORTIOXETINE HBR 10 MG PO TABS: 10.0000 mg | ORAL_TABLET | Freq: Every day | ORAL | 1 refills | Status: DC

## 2020-03-28 MED ORDER — PROPRANOLOL HCL 10 MG PO TABS: 10.0000 mg | ORAL_TABLET | Freq: Three times a day (TID) | ORAL | 0 refills | Status: DC | PRN

## 2020-03-28 MED ORDER — DEXMETHYLPHENIDATE HCL 5 MG PO TABS: 5.0000 mg | ORAL_TABLET | Freq: Three times a day (TID) | ORAL | 0 refills | Status: DC

## 2020-03-28 NOTE — Progress Notes (Signed)
Virtual Visit via Telephone Note  I connected with Milos A Pica IV on 03/28/20 at  3:30 PM EST by telephone and verified that I am speaking with the correct person using two identifiers.  Location: Patient: home Provider: office   I discussed the limitations, risks, security and privacy concerns of performing an evaluation and management service by telephone and the availability of in person appointments. I also discussed with the patient that there may be a patient responsible charge related to this service. The patient expressed understanding and agreed to proceed.   History of Present Illness: Esteban's mom states he is taking Focalin on days he goes to class but feels he needs a higher doses. Ercil takes Focalin in the evening when he has to study in the evening. He is working at [employer]. He has been overwhelmed, anxious and feeling bad about himself.   Jonny RuizJohn states he is not interested or motivated in his classes. He did not do well in 2/3 classes. The Focalin 10mg  works for about 3 hrs and then wears off. Barnes feels anxious, overwhelmed and depressed. He feels angry with himself a lot. He often feels like he has no purpose and sometimes wonders if he should join the [employer] or run away. He states it is always like this. Kimothy feels better with warm weather or swimming. Daxton has on/off SI about 1-2x/month when really down. He sometimes has passing thoughts of SIB but has never engaged in it.   Observations/Objective:  General Appearance: unable to assess  Eye Contact:  unable to assess  Speech:  Clear and Coherent and Normal Rate  Volume:  Normal  Mood:  Anxious and Depressed  Affect:  Congruent  Thought Process:  Goal Directed, Linear, and Descriptions of Associations: Intact  Orientation:  Full (Time, Place, and Person)  Thought Content:  Logical  Suicidal Thoughts:  No  Homicidal Thoughts:  No  Memory:  Immediate;   Good  Judgement:  Good  Insight:  Good  Psychomotor Activity: unable  to assess  Concentration:  Concentration: Good  Recall:  Good  Fund of Knowledge:  Good  Language:  Good  Akathisia:  unable to assess  Handed:  Right  AIMS (if indicated):     Assets:  Communication Skills Desire for Improvement Financial Resources/Insurance Housing Resilience Social Support Talents/Skills Transportation Vocational/Educational  ADL's:  unable to assess  Cognition:  WNL  Sleep:         Assessment and Plan: 1. Attention deficit hyperactivity disorder (ADHD), predominantly inattentive type - increase dexmethylphenidate (FOCALIN) 5 MG tablet; Take 1 tablet (5 mg total) by mouth in the morning, at noon, and at bedtime.  Dispense: 90 tablet; Refill: 0 - dexmethylphenidate (FOCALIN) 5 MG tablet; Take 1 tablet (5 mg total) by mouth in the morning, at noon, and at bedtime.  Dispense: 90 tablet; Refill: 0  2. GAD (generalized anxiety disorder) - propranolol (INDERAL) 10 MG tablet; Take 1 tablet (10 mg total) by mouth 3 (three) times daily as needed (anxiety).  Dispense: 270 tablet; Refill: 0 - vortioxetine HBr (TRINTELLIX) 10 MG TABS tablet; Take 1 tablet (10 mg total) by mouth daily.  Dispense: 30 tablet; Refill: 1  3. Major depressive disorder with single episode, in partial remission (HCC) -  Start vortioxetine HBr (TRINTELLIX) 10 MG TABS tablet; Take 1 tablet (10 mg total) by mouth daily.  Dispense: 30 tablet; Refill: 1   discontinue Prozac  Engaging in therapy   Follow Up Instructions: In 3-4 weeks or  sooner if needed   I discussed the assessment and treatment plan with the patient. The patient was provided an opportunity to ask questions and all were answered. The patient agreed with the plan and demonstrated an understanding of the instructions.   The patient was advised to call back or seek an in-person evaluation if the symptoms worsen or if the condition fails to improve as anticipated.  I provided 30 minutes of non-face-to-face time during this  encounter.   Oletta Darter, MD

## 2020-04-04 ENCOUNTER — Other Ambulatory Visit (HOSPITAL_COMMUNITY): Payer: Self-pay | Admitting: *Deleted

## 2020-04-04 MED ORDER — VENLAFAXINE HCL ER 75 MG PO CP24: 75.0000 mg | ORAL_CAPSULE | Freq: Every day | ORAL | 0 refills | Status: DC

## 2020-04-18 ENCOUNTER — Telehealth (HOSPITAL_COMMUNITY): Payer: 59 | Admitting: Psychiatry

## 2020-05-01 ENCOUNTER — Telehealth (HOSPITAL_COMMUNITY): Payer: Self-pay | Admitting: *Deleted

## 2020-05-01 NOTE — Telephone Encounter (Signed)
Pt mother called requesting refill of the Effexor XR 75 mg. Mother states that pt is doing well on medication and he wants to continue it. Pt next appointment is on 05/23/20. Please review.

## 2020-05-02 ENCOUNTER — Other Ambulatory Visit (HOSPITAL_COMMUNITY): Payer: Self-pay | Admitting: Psychiatry

## 2020-05-02 NOTE — Telephone Encounter (Signed)
done

## 2020-05-23 ENCOUNTER — Telehealth (HOSPITAL_COMMUNITY): Payer: 59 | Admitting: Psychiatry

## 2020-05-23 ENCOUNTER — Telehealth (HOSPITAL_COMMUNITY): Payer: Self-pay | Admitting: Psychiatry

## 2020-05-23 ENCOUNTER — Other Ambulatory Visit: Payer: Self-pay

## 2020-05-23 NOTE — Telephone Encounter (Signed)
I called the patient at our scheduled appointment time. There was no answer. I was unable to leave a voice message. I was unable to speak with pt today.

## 2020-05-27 ENCOUNTER — Telehealth (HOSPITAL_COMMUNITY): Payer: Self-pay | Admitting: *Deleted

## 2020-05-27 NOTE — Telephone Encounter (Signed)
Patient called and requested refill of Effexor.  He stated it was working well.  He has been asked to make another appt.  Please review for refill.

## 2020-05-28 ENCOUNTER — Other Ambulatory Visit (HOSPITAL_COMMUNITY): Payer: Self-pay | Admitting: Psychiatry

## 2020-05-30 ENCOUNTER — Other Ambulatory Visit (HOSPITAL_COMMUNITY): Payer: Self-pay | Admitting: Psychiatry

## 2020-05-30 DIAGNOSIS — F411 Generalized anxiety disorder: Secondary | ICD-10-CM

## 2020-05-30 DIAGNOSIS — F324 Major depressive disorder, single episode, in partial remission: Secondary | ICD-10-CM

## 2020-05-30 MED ORDER — VENLAFAXINE HCL ER 75 MG PO CP24: 75.0000 mg | ORAL_CAPSULE | Freq: Every day | ORAL | 1 refills | Status: DC

## 2020-05-30 NOTE — Telephone Encounter (Signed)
Yes please schedule him an appointment in the next few weeks. I have sent a refill of the Effexor to his pharmacy

## 2020-07-02 ENCOUNTER — Other Ambulatory Visit (HOSPITAL_COMMUNITY): Payer: Self-pay | Admitting: Psychiatry

## 2020-07-02 DIAGNOSIS — F324 Major depressive disorder, single episode, in partial remission: Secondary | ICD-10-CM

## 2020-07-02 DIAGNOSIS — F411 Generalized anxiety disorder: Secondary | ICD-10-CM

## 2020-07-04 ENCOUNTER — Other Ambulatory Visit (HOSPITAL_COMMUNITY): Payer: Self-pay | Admitting: Psychiatry

## 2020-07-04 DIAGNOSIS — F324 Major depressive disorder, single episode, in partial remission: Secondary | ICD-10-CM

## 2020-07-04 DIAGNOSIS — F411 Generalized anxiety disorder: Secondary | ICD-10-CM

## 2020-07-04 MED ORDER — VENLAFAXINE HCL ER 75 MG PO CP24: ORAL_CAPSULE | ORAL | 0 refills | Status: DC

## 2020-07-18 ENCOUNTER — Telehealth (HOSPITAL_COMMUNITY): Payer: Self-pay | Admitting: *Deleted

## 2020-07-18 NOTE — Telephone Encounter (Signed)
Pt mother, Dewayne Hatch, called requesting a refill on the Effexor which was last written on 07/04/20 #30. Pt missed last two appointments and none scheduled. I'll work on getting a appointment then refill or 30 days if that works?

## 2020-07-20 NOTE — Telephone Encounter (Signed)
Yes once appointment is scheduled please give meds to bridge.

## 2020-07-23 ENCOUNTER — Encounter: Payer: Self-pay | Admitting: Family Medicine

## 2020-07-23 ENCOUNTER — Ambulatory Visit (INDEPENDENT_AMBULATORY_CARE_PROVIDER_SITE_OTHER): Payer: Managed Care, Other (non HMO) | Admitting: Family Medicine

## 2020-07-23 ENCOUNTER — Other Ambulatory Visit: Payer: Self-pay

## 2020-07-23 DIAGNOSIS — R040 Epistaxis: Secondary | ICD-10-CM

## 2020-07-23 DIAGNOSIS — M545 Low back pain, unspecified: Secondary | ICD-10-CM

## 2020-07-23 MED ORDER — LORATADINE 10 MG PO TABS: 10.0000 mg | ORAL_TABLET | Freq: Every day | ORAL | Status: DC

## 2020-07-23 NOTE — Patient Instructions (Addendum)
Limit ibuprofen.  Don't blow your now.  Take claritin 10mg  a day.  If you still have bleeding, then pinch it for 5 minutes and update me.    Use the back exercises and let me know if that isn't helping.  Take care.  Glad to see you.

## 2020-07-23 NOTE — Progress Notes (Signed)
This visit occurred during the SARS-CoV-2 public health emergency.  Safety protocols were in place, including screening questions prior to the visit, additional usage of staff PPE, and extensive cleaning of exam room while observing appropriate contact time as indicated for disinfecting solutions.  Back pain going on for months intermittently, better recently.  Lower back pain when present.  No trauma.  Midline L spine and lower T spine.  No foot sx, no foot drop.  No B/B sx.    Epistaxis. 2-3 times per day when it happened. It was going on for a few days.  Last happened about 2-3 weeks ago.  Denies cocaine use.  No trauma.  He has not been using Flonase.  Taking classes at [employer].  Working at [employer]   Meds, vitals, and allergies reviewed.   ROS: Per HPI unless specifically indicated in ROS section   nad ncat Neck supple, no LA Nasal exam noted for lateral portion of right nare with a small irritated area with previous stigmata of bleeding but no active bleed.  No other bleeding site noted in either nasal passage.  No perforation seen the septum. OP wnl rrr ctab Back without midline tenderness.  No scoliosis noted Lower back not tender.  Straight leg raise negative and strength and sensation grossly within normal limits lower extremities. He does have some horizontal lines of hyperpigmentation on the back that are symmetric.  Unclear source.

## 2020-07-24 NOTE — Assessment & Plan Note (Signed)
No emergent findings.  Okay for outpatient follow-up.  Handout given regarding lower back exercises.  Discussed.  He can use those and update me as needed.  He agrees.

## 2020-07-24 NOTE — Assessment & Plan Note (Signed)
Avoid Flonase.  Denies cocaine.  Avoid nose blowing.  No bleeding at this point.  Routine cautions given to patient.  If he continues to have trouble that he will let me know and we can refer him to ENT for consideration of cautery.  Reasonable to try Claritin 10 mg a day in the meantime in case allergies are contributing at all.

## 2020-08-06 ENCOUNTER — Other Ambulatory Visit (HOSPITAL_COMMUNITY): Payer: Self-pay | Admitting: *Deleted

## 2020-08-06 DIAGNOSIS — F411 Generalized anxiety disorder: Secondary | ICD-10-CM

## 2020-08-06 DIAGNOSIS — F324 Major depressive disorder, single episode, in partial remission: Secondary | ICD-10-CM

## 2020-08-06 MED ORDER — VENLAFAXINE HCL ER 75 MG PO CP24: ORAL_CAPSULE | ORAL | 0 refills | Status: DC

## 2020-08-08 ENCOUNTER — Other Ambulatory Visit (HOSPITAL_COMMUNITY): Payer: Self-pay | Admitting: *Deleted

## 2020-08-08 DIAGNOSIS — F411 Generalized anxiety disorder: Secondary | ICD-10-CM

## 2020-08-08 DIAGNOSIS — F324 Major depressive disorder, single episode, in partial remission: Secondary | ICD-10-CM

## 2020-08-08 MED ORDER — VENLAFAXINE HCL ER 75 MG PO CP24: ORAL_CAPSULE | ORAL | 0 refills | Status: DC

## 2020-08-09 ENCOUNTER — Other Ambulatory Visit (HOSPITAL_COMMUNITY): Payer: Self-pay | Admitting: Psychiatry

## 2020-08-09 DIAGNOSIS — F411 Generalized anxiety disorder: Secondary | ICD-10-CM

## 2020-08-09 DIAGNOSIS — F324 Major depressive disorder, single episode, in partial remission: Secondary | ICD-10-CM

## 2020-08-29 ENCOUNTER — Telehealth (INDEPENDENT_AMBULATORY_CARE_PROVIDER_SITE_OTHER): Payer: 59 | Admitting: Psychiatry

## 2020-08-29 ENCOUNTER — Other Ambulatory Visit: Payer: Self-pay

## 2020-08-29 DIAGNOSIS — F411 Generalized anxiety disorder: Secondary | ICD-10-CM | POA: Diagnosis not present

## 2020-08-29 DIAGNOSIS — F900 Attention-deficit hyperactivity disorder, predominantly inattentive type: Secondary | ICD-10-CM | POA: Diagnosis not present

## 2020-08-29 DIAGNOSIS — F324 Major depressive disorder, single episode, in partial remission: Secondary | ICD-10-CM | POA: Diagnosis not present

## 2020-08-29 MED ORDER — DEXMETHYLPHENIDATE HCL 5 MG PO TABS: 5.0000 mg | ORAL_TABLET | Freq: Three times a day (TID) | ORAL | 0 refills | Status: DC

## 2020-08-29 MED ORDER — VENLAFAXINE HCL ER 150 MG PO CP24: 150.0000 mg | ORAL_CAPSULE | Freq: Every day | ORAL | 0 refills | Status: DC

## 2020-08-29 MED ORDER — PROPRANOLOL HCL 20 MG PO TABS: 20.0000 mg | ORAL_TABLET | Freq: Two times a day (BID) | ORAL | 2 refills | Status: DC | PRN

## 2020-08-29 NOTE — Progress Notes (Signed)
Virtual Visit via Video Note  I connected with Ian Lamb on 08/29/20 at 11:30 AM EDT by a video enabled telemedicine application and verified that I am speaking with the correct person using two identifiers.  Location: Patient: home Provider: office   I discussed the limitations of evaluation and management by telemedicine and the availability of in person appointments. The patient expressed understanding and agreed to proceed.  History of Present Illness: Ian Lamb is good and is moving to [place] next week for the summer. His depression is good except when dealing with school. 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 It was causing severe stress and depression and irritable. This occurred about 2 weeks ago. Over the last 2 weeks his depression has been on and off. If someone gets to him then he will be depressed for a day. He thinks he has been depressed about 5 days over the last 14 days. He is denying anhedonia and isolation. He has cut back on smoking marijuana so that is not daily. He has been sleeping longer hours than usual. He is avoiding talking to his mom. They often argue. Ian Lamb has very poor self esteem and has a lot of negative self talk.  His energy is ok. His appetite is very poor when he is stressed out. This has occurred about 2-3 times since we last spoke. His concentration has been ok except when stressed. He finds it difficult to calm himself enough to focus when stressed. He has been taking the Focalin daily and it does help. It wears off after 2-4 hours. He has only been taking it in the morning.  Ian Lamb would like to take it at least twice a day. He denies SI/HI. His anxiety remains high. He gets angry and then gets a headache. It feels like chemical are leaking out and causing him to be angry like when his veins are popping. The Propranolol helps a little but other times when really overwhelmed it does not work. He has taken up to 40mg  at one time and it didn't help.     Observations/Objective: Psychiatric Specialty Exam: ROS  There were no vitals taken for this visit.There is no height or weight on file to calculate BMI.  General Appearance: Casual  Eye Contact:  Good  Speech:  Clear and Coherent and Normal Rate  Volume:  Normal  Mood:  Anxious and Depressed  Affect:  Full Range  Thought Process:  Coherent and Descriptions of Associations: Intact  Orientation:  Full (Time, Place, and Person)  Thought Content:  Logical  Suicidal Thoughts:  No  Homicidal Thoughts:  No  Memory:  Immediate;   Good  Judgement:  Fair  Insight:  Good  Psychomotor Activity:  Normal  Concentration:  Concentration: Good  Recall:  Good  Fund of Knowledge:  Good  Language:  Good  Akathisia:  No  Handed:  Right  AIMS (if indicated):     Assets:  Communication Skills Desire for Improvement Financial Resources/Insurance Housing Intimacy Leisure Time Physical Health Resilience Social Support Talents/Skills Transportation Vocational/Educational  ADL's:  Intact  Cognition:  WNL  Sleep:        Assessment and Plan: Depression screen [facility] 2/9 08/29/2020 08/29/2020 12/30/2016 02/19/2016  Decreased Interest 0 0 0 0  Down, Depressed, Hopeless 2 1 2  0  PHQ - 2 Score 2 1 2  0  Altered sleeping 2 2 1  -  Tired, decreased energy 0 1 1 -  Change in appetite 1 1 1  -  Feeling bad or failure about yourself  3 - 3 -  Trouble concentrating 1 1 3  -  Moving slowly or fidgety/restless 0 0 1 -  Suicidal thoughts 0 - 0 -  PHQ-9 Score 9 6 12  -  Difficult doing work/chores Very difficult - Extremely dIfficult -    Flowsheet Row Video [facility]/29/[facility]  C-SSRS RISK CATEGORY No Risk     -increase Effexor - increase Propralolol  1. Attention deficit hyperactivity disorder (ADHD), predominantly inattentive type - dexmethylphenidate (FOCALIN) 5 MG tablet; Take 1 tablet (5 mg total) by mouth 3 (three) times daily before  meals.  Dispense: 90 tablet; Refill: 0 - dexmethylphenidate (FOCALIN) 5 MG tablet; Take 1 tablet (5 mg total) by mouth 3 (three) times daily before meals.  Dispense: 90 tablet; Refill: 0  2. GAD (generalized anxiety disorder) - propranolol (INDERAL) 20 MG tablet; Take 1 tablet (20 mg total) by mouth 2 (two) times daily as needed (anxiety).  Dispense: 60 tablet; Refill: 2 - venlafaxine XR (EFFEXOR-XR) 150 MG 24 hr capsule; Take 1 capsule (150 mg total) by mouth daily with breakfast.  Dispense: 90 capsule; Refill: 0  3. Major depressive disorder with single episode, in partial remission (HCC) - venlafaxine XR (EFFEXOR-XR) 150 MG 24 hr capsule; Take 1 capsule (150 mg total) by mouth daily with breakfast.  Dispense: 90 capsule; Refill: 0      Follow Up Instructions: In 2 months or sooner if needed   I discussed the assessment and treatment plan with the patient. The patient was provided an opportunity to ask questions and all were answered. The patient agreed with the plan and demonstrated an understanding of the instructions.   The patient was advised to call back or seek an in-person evaluation if the symptoms worsen or if the condition fails to improve as anticipated.    , MD

## 2020-09-10 ENCOUNTER — Encounter: Payer: Self-pay | Admitting: Family Medicine

## 2020-09-19 ENCOUNTER — Encounter: Payer: Self-pay | Admitting: Family Medicine

## 2020-09-20 ENCOUNTER — Telehealth: Payer: Managed Care, Other (non HMO) | Admitting: Family Medicine

## 2020-09-20 ENCOUNTER — Encounter: Payer: Self-pay | Admitting: Family Medicine

## 2020-09-20 ENCOUNTER — Ambulatory Visit: Payer: Managed Care, Other (non HMO) | Admitting: Family Medicine

## 2020-09-20 ENCOUNTER — Telehealth (INDEPENDENT_AMBULATORY_CARE_PROVIDER_SITE_OTHER): Payer: Managed Care, Other (non HMO) | Admitting: Family Medicine

## 2020-09-20 DIAGNOSIS — H9202 Otalgia, left ear: Secondary | ICD-10-CM | POA: Insufficient documentation

## 2020-09-20 DIAGNOSIS — R21 Rash and other nonspecific skin eruption: Secondary | ICD-10-CM

## 2020-09-20 MED ORDER — TRIAMCINOLONE ACETONIDE 0.1 % EX CREA: 1.0000 "application " | TOPICAL_CREAM | Freq: Two times a day (BID) | CUTANEOUS | 1 refills | Status: DC | PRN

## 2020-09-20 MED ORDER — OFLOXACIN 0.3 % OT SOLN: 5.0000 [drp] | Freq: Every day | OTIC | 0 refills | Status: DC

## 2020-09-20 MED ORDER — HYDROCORTISONE-ACETIC ACID 1-2 % OT SOLN: 4.0000 [drp] | Freq: Three times a day (TID) | OTIC | 0 refills | Status: DC

## 2020-09-20 NOTE — Telephone Encounter (Signed)
See other mychart message. Patient is to call to make an appt.

## 2020-09-20 NOTE — Progress Notes (Signed)
Patient ID: Ian Lamb, male    DOB: [birth year], 20 y.o.   MRN: [id number]  Virtual visit completed through MyChart, a video enabled telemedicine application. Due to national recommendations of social distancing due to COVID-19, a virtual visit is felt to be most appropriate for this patient at this time. Reviewed limitations, risks, security and privacy concerns of performing a virtual visit and the availability of in person appointments. I also reviewed that there may be a patient responsible charge related to this service. The patient agreed to proceed.   Interactive audio and video telecommunications were attempted between myself and Ian Lamb, however failed due to patient having technical difficulties OR patient not having access to video capability.  We continued and completed visit with audio only.  Time: 4:42pm - 4:55pm   Patient location: home Provider location: [place] at [facility], office Persons participating in this virtual visit: patient, provider, mother  If any vitals were documented, they were collected by patient at home unless specified below.    Temp (!) 97 F (36.1 C)   Wt 180 lb (81.6 kg) Comment: per patient  BMI 25.10 kg/m    CC: ST, earache, rash  Subjective:   HPI: Ian Lamb is a 20 y.o. male presenting on 09/20/2020 for Rash (Under the arm pits, thighs and private area x 4 days) and Sore Throat (And earache-possible swimmers ear per patient.)   "[name]" Had virtual visit scheduled through another office - states he never received phone call.   "I think I have swimmer's ear" L ear pain and throat pain whenever he yawns or bites into burger or opens mouth for the last 5 days. [place] 3 days in a row. Treated with peroxide into ear with some benefit. Q tip into ear painful - advised not to do this. No drainage from the ear, no hearing changes, fevers/chills, abd pain, nausea, headache, nasal congestion, sinus pressure. No  cough. Episode of emesis x1 this morning after he ate bad biscuit.   New rash for about 5 days started to private area, now has spread into neck, into axilla, down thighs and legs. Thinks he may have got into poison ivy.  Very allergic to poison ivy in the past, has tolerated cream well in the past.    New landscaping job - 7am-7pm.      Relevant past medical, surgical, family and social history reviewed and updated as indicated. Interim medical history since our last visit reviewed. Allergies and medications reviewed and updated. Outpatient Medications Prior to Visit  Medication Sig Dispense Refill  . propranolol (INDERAL) 20 MG tablet Take 1 tablet (20 mg total) by mouth 2 (two) times daily as needed (anxiety). 60 tablet 2  . venlafaxine XR (EFFEXOR-XR) 150 MG 24 hr capsule Take 1 capsule (150 mg total) by mouth daily with breakfast. 90 capsule 0  . dexmethylphenidate (FOCALIN) 5 MG tablet Take 1 tablet (5 mg total) by mouth 3 (three) times daily before meals. (Patient not taking: Reported on 09/20/2020) 90 tablet 0  . dexmethylphenidate (FOCALIN) 5 MG tablet Take 1 tablet (5 mg total) by mouth 3 (three) times daily before meals. (Patient not taking: Reported on 09/20/2020) 90 tablet 0  . loratadine (CLARITIN) 10 MG tablet Take 1 tablet (10 mg total) by mouth daily.     No facility-administered medications prior to visit.     Per HPI unless specifically indicated in ROS section below  Review of Systems Objective:  Temp (!) 97 F (36.1 C)   Wt 180 lb (81.6 kg) Comment: per patient  BMI 25.10 kg/m   Wt Readings from Last 3 Encounters:  09/20/20 180 lb (81.6 kg)  07/23/20 195 lb (88.5 kg)  02/06/20 181 lb 5 oz (82.2 kg)       Physical exam: Pulm: speaks in complete sentences without increased work of breathing Psych: normal mood, normal thought content      Assessment & Plan:   Problem List Items Addressed This Visit    Skin rash    Unclear cause of rash on limited evaluation  over the phone.  Given h/o poison ivy and mom states this is similar, will send triamcinolone cream to pharmacy with skin steroid precautions, however discussed poison ivy does not typically start in groin. Recommend triamcinolone cream to affected areas on arms/legs/trunk, avoid use in axilla, groin. May try clotrimazole for rash in groin/underarms to treat possible tinea cruris.  Reviewed need to seek in-person evaluation if not improving with this treatment.      Left ear pain    Gives good story for external otitis - will treat empirically with ofloxacin ear drops as unable to r/o perf. Discussed need for in-person eval if not improving with treatment. Pt and mom agree with plan.           Meds ordered this encounter  Medications  . triamcinolone cream (KENALOG) 0.1 %    Sig: Apply 1 application topically 2 (two) times daily as needed.    Dispense:  30 g    Refill:  1  . DISCONTD: acetic acid-hydrocortisone (VOSOL-HC) OTIC solution    Sig: Place 4 drops into the left ear 3 (three) times daily.    Dispense:  10 mL    Refill:  0  . ofloxacin (FLOXIN OTIC) 0.3 % OTIC solution    Sig: Place 5 drops into the left ear daily. For 7 days    Dispense:  5 mL    Refill:  0    Use this in place of vosol hc   No orders of the defined types were placed in this encounter.   I discussed the assessment and treatment plan with the patient. The patient was provided an opportunity to ask questions and all were answered. The patient agreed with the plan and demonstrated an understanding of the instructions. The patient was advised to call back or seek an in-person evaluation if the symptoms worsen or if the condition fails to improve as anticipated.  Follow up plan: No follow-ups on file.  Eustaquio Boyden, MD

## 2020-09-20 NOTE — Assessment & Plan Note (Signed)
Gives good story for external otitis - will treat empirically with ofloxacin ear drops as unable to r/o perf. Discussed need for in-person eval if not improving with treatment. Pt and mom agree with plan.

## 2020-09-20 NOTE — Assessment & Plan Note (Signed)
Unclear cause of rash on limited evaluation over the phone.  Given h/o poison ivy and mom states this is similar, will send triamcinolone cream to pharmacy with skin steroid precautions, however discussed poison ivy does not typically start in groin. Recommend triamcinolone cream to affected areas on arms/legs/trunk, avoid use in axilla, groin. May try clotrimazole for rash in groin/underarms to treat possible tinea cruris.  Reviewed need to seek in-person evaluation if not improving with this treatment.

## 2020-09-23 ENCOUNTER — Telehealth: Payer: Managed Care, Other (non HMO) | Admitting: Family Medicine

## 2020-09-29 ENCOUNTER — Encounter: Payer: Self-pay | Admitting: Family Medicine

## 2020-09-30 ENCOUNTER — Other Ambulatory Visit: Payer: Self-pay | Admitting: Family Medicine

## 2020-09-30 MED ORDER — TRIAMCINOLONE ACETONIDE 0.1 % EX CREA: 1.0000 "application " | TOPICAL_CREAM | Freq: Two times a day (BID) | CUTANEOUS | 1 refills | Status: DC | PRN

## 2020-10-31 ENCOUNTER — Other Ambulatory Visit: Payer: Self-pay

## 2020-10-31 ENCOUNTER — Telehealth (HOSPITAL_COMMUNITY): Payer: 59 | Admitting: Psychiatry

## 2020-11-19 ENCOUNTER — Other Ambulatory Visit (HOSPITAL_COMMUNITY): Payer: Self-pay | Admitting: Psychiatry

## 2020-11-19 DIAGNOSIS — F324 Major depressive disorder, single episode, in partial remission: Secondary | ICD-10-CM

## 2020-11-19 DIAGNOSIS — F411 Generalized anxiety disorder: Secondary | ICD-10-CM

## 2020-11-27 ENCOUNTER — Telehealth (HOSPITAL_COMMUNITY): Payer: Self-pay | Admitting: Psychiatry

## 2020-11-27 ENCOUNTER — Telehealth (HOSPITAL_COMMUNITY): Payer: Self-pay | Admitting: *Deleted

## 2020-11-27 NOTE — Telephone Encounter (Signed)
D:  Ian Ahmadi, LPN referred pt to [facility].  A:  Placed call to orient pt and provide him with a start date.  According to pt, he would like to wait and start whenever the group meets back in person (after Labor Day).  Will add patient to the list of pts who are wanting face to face group as opposed to virtual.  Inform Elon Jester and Dr. Michae Kava.  Will contact pt with a start date after Labor Day.  R:  Pt receptive.

## 2020-11-27 NOTE — Telephone Encounter (Signed)
Pt and mother left VM regarding refills and appointment. Pt now has an appointment upcoming on 11/28/20 and will have enough medication. In addition pt and mother asked about IOP for pt. IOP coordinator Jeri Modena notified and will f/u with pt. FYI.

## 2020-11-28 ENCOUNTER — Other Ambulatory Visit: Payer: Self-pay

## 2020-11-28 ENCOUNTER — Telehealth (INDEPENDENT_AMBULATORY_CARE_PROVIDER_SITE_OTHER): Payer: 59 | Admitting: Psychiatry

## 2020-11-28 DIAGNOSIS — F900 Attention-deficit hyperactivity disorder, predominantly inattentive type: Secondary | ICD-10-CM

## 2020-11-28 DIAGNOSIS — F324 Major depressive disorder, single episode, in partial remission: Secondary | ICD-10-CM

## 2020-11-28 DIAGNOSIS — F411 Generalized anxiety disorder: Secondary | ICD-10-CM

## 2020-11-28 MED ORDER — VENLAFAXINE HCL ER 75 MG PO CP24: 225.0000 mg | ORAL_CAPSULE | Freq: Every day | ORAL | 0 refills | Status: DC

## 2020-11-28 MED ORDER — AMPHETAMINE-DEXTROAMPHETAMINE 10 MG PO TABS
10.0000 mg | ORAL_TABLET | Freq: Every day | ORAL | 0 refills | Status: DC
Start: 2020-11-28 — End: 2021-02-06

## 2020-11-28 MED ORDER — PROPRANOLOL HCL 20 MG PO TABS: 20.0000 mg | ORAL_TABLET | Freq: Two times a day (BID) | ORAL | 0 refills | Status: DC | PRN

## 2020-11-28 NOTE — Progress Notes (Signed)
Virtual Visit via Telephone Note  I connected with Ian Lamb on 11/28/20 at  4:15 PM EDT by phone and verified that I am speaking with the correct person using two identifiers. He was not able to log onto mychart to do a video session.   Location: Patient: home Provider: office   I discussed the limitations of evaluation and management by telemedicine and the availability of in person appointments. The patient expressed understanding and agreed to proceed.  History of Present Illness: Ian Lamb is back to his mom's for the winter.  Mom states he is needs help with some anxiety and better communication. He is not taking any stimulants for ADHD and wants to try Adderall. He needs refills for Effexor and Propranolol. He has cut back on alcohol use. Ian Lamb is thinking about starting IOP after coming back from vacation. He is going to take some night classes in things he enjoys. Ian Lamb will work at [employer] and then get another job on top of that. Mom feels he is matured. He is taking his meds and doing the things he needs to do but still struggles with depression and anxiety.   Ian Lamb states he is unable to concentrate at all. Despite that he has been more productive. He is slow at work but he is still getting the job done. He would like to try Adderall. He plans to get take it only when needed for tests or work. Ian Lamb berates himself whenever he is not performing to desired level. Ian Lamb stopped drinking alcohol about 2 months ago because it was making him sick. His girlfriend broke up  with him about 2 weeks ago due to feeling that he was depressed. Over the last few days he has been feeling better. He has been stressed and is smoking delta 8 at night. It helps him relax and sleep.  His sleep is on/off due to his poor sleep schedule. Ian Lamb is active and denies isolation. His appetite is improving and he is eating 3 meals a day for the last 2 weeks. Ian Lamb is having some stomach upset with diarrhea. He plans  to set up an appointment with his PCP tomorrow. Ian Lamb denies SI/HI. His anxiety is most noticeable with stressful situations. It is also present to a mild degree before bed. He states overall his anxiety is better over the last 2 weeks. He feels he is managing it better. Poor sleep is a big trigger for anxiety. Propranolol helps with anxiety and irritability. He has been consistently taking his meds and is proud of his progress.   Ian Lamb is looking forward to IOP to help him learn how to deal with his depression and anxiety.    Observations/Objective:  General Appearance: unable to assess  Eye Contact:  unable to assess  Speech:  Clear and Coherent and Slow  Volume:  Normal  Mood:  Anxious and Depressed  Affect:  Full Range  Thought Process:  Goal Directed, Linear, and Descriptions of Associations: Intact  Orientation:  Full (Time, Place, and Person)  Thought Content:  Logical  Suicidal Thoughts:  No  Homicidal Thoughts:  No  Memory:  Immediate;   Good  Judgement:  Good  Insight:  Good  Psychomotor Activity: unable to assess  Concentration:  Concentration: Good  Recall:  Good  Fund of Knowledge:  Good  Language:  Good  Akathisia:  unable to assess  Handed:  unable to assess  AIMS (if indicated):     Assets:  Communication Skills Desire  for Improvement Financial Resources/Insurance Housing Resilience Social Support Talents/Skills Transportation Vocational/Educational  ADL's:  unable to assess  Cognition:  WNL  Sleep:        Assessment and Plan: Depression screen [facility] 2/9 11/28/2020 08/29/2020 08/29/2020 12/30/2016 02/19/2016  Decreased Interest 1 0 0 0 0  Down, Depressed, Hopeless 1 2 1 2  0  PHQ - 2 Score 2 2 1 2  0  Altered sleeping 1 2 2 1  -  Tired, decreased energy 0 0 1 1 -  Change in appetite 0 1 1 1  -  Feeling bad or failure about yourself  2 3 - 3 -  Trouble concentrating 3 1 1 3  -  Moving slowly or fidgety/restless 0 0 0 1 -  Suicidal thoughts 0 0 - 0 -  PHQ-9  Score 8 9 6 12  -  Difficult doing work/chores Very difficult Very difficult - Extremely dIfficult -    Flowsheet Row Video Visit from 11/28/2020 in [facility] Video Visit from 08/29/2020 in [facility]  C-SSRS RISK CATEGORY No Risk No Risk       - start Adderall 10mg  po qD for ADHD. He did not like Focalin or Ritalin  - increase Effexor XR 225mg  qD  1. Attention deficit hyperactivity disorder (ADHD), predominantly inattentive type - amphetamine-dextroamphetamine (ADDERALL) 10 MG tablet; Take 1 tablet (10 mg total) by mouth daily.  Dispense: 30 tablet; Refill: 0 - amphetamine-dextroamphetamine (ADDERALL) 10 MG tablet; Take 1 tablet (10 mg total) by mouth daily.  Dispense: 30 tablet; Refill: 0  2. Major depressive disorder with single episode, in partial remission (HCC) - venlafaxine XR (EFFEXOR XR) 75 MG 24 hr capsule; Take 3 capsules (225 mg total) by mouth daily.  Dispense: 270 capsule; Refill: 0  3. GAD (generalized anxiety disorder) - propranolol (INDERAL) 20 MG tablet; Take 1 tablet (20 mg total) by mouth 2 (two) times daily as needed (anxiety).  Dispense: 180 tablet; Refill: 0 - venlafaxine XR (EFFEXOR XR) 75 MG 24 hr capsule; Take 3 capsules (225 mg total) by mouth daily.  Dispense: 270 capsule; Refill: 0   Follow Up Instructions: In 4-6 weeks or sooner if needed   I discussed the assessment and treatment plan with the patient. The patient was provided an opportunity to ask questions and all were answered. The patient agreed with the plan and demonstrated an understanding of the instructions.   The patient was advised to call back or seek an in-person evaluation if the symptoms worsen or if the condition fails to improve as anticipated.  I provided 20 minutes of non-face-to-face time during this encounter.   , MD

## 2020-12-30 ENCOUNTER — Telehealth (HOSPITAL_COMMUNITY): Payer: Self-pay | Admitting: *Deleted

## 2020-12-30 NOTE — Telephone Encounter (Signed)
Documentation faxed to [facility] per mother and pt request. Mother and pt aware.

## 2020-12-31 ENCOUNTER — Encounter: Payer: Self-pay | Admitting: Family Medicine

## 2021-01-01 ENCOUNTER — Ambulatory Visit (INDEPENDENT_AMBULATORY_CARE_PROVIDER_SITE_OTHER): Payer: Managed Care, Other (non HMO) | Admitting: Nurse Practitioner

## 2021-01-01 ENCOUNTER — Ambulatory Visit (INDEPENDENT_AMBULATORY_CARE_PROVIDER_SITE_OTHER)
Admission: RE | Admit: 2021-01-01 | Discharge: 2021-01-01 | Disposition: A | Discharge status: Home / Self Care | Payer: Managed Care, Other (non HMO) | Source: Ambulatory Visit | Attending: Nurse Practitioner | Admitting: Nurse Practitioner

## 2021-01-01 ENCOUNTER — Other Ambulatory Visit: Payer: Self-pay

## 2021-01-01 ENCOUNTER — Encounter: Payer: Self-pay | Admitting: Nurse Practitioner

## 2021-01-01 DIAGNOSIS — S060X0A Concussion without loss of consciousness, initial encounter: Secondary | ICD-10-CM | POA: Diagnosis not present

## 2021-01-01 NOTE — Patient Instructions (Addendum)
Your exam was reassuring in office. You did have some muscle tightness around your neck Please let us know if your symptoms get worse or fail to improve.  You can take Tylenol and Motrin/Ibuprofen over the counter as needed for aches and pains. Follow directions on bottle
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
## 2021-01-02 ENCOUNTER — Telehealth (INDEPENDENT_AMBULATORY_CARE_PROVIDER_SITE_OTHER): Payer: 59 | Admitting: Psychiatry

## 2021-01-02 DIAGNOSIS — F411 Generalized anxiety disorder: Secondary | ICD-10-CM | POA: Diagnosis not present

## 2021-01-02 DIAGNOSIS — F900 Attention-deficit hyperactivity disorder, predominantly inattentive type: Secondary | ICD-10-CM | POA: Diagnosis not present

## 2021-01-02 DIAGNOSIS — F324 Major depressive disorder, single episode, in partial remission: Secondary | ICD-10-CM

## 2021-01-02 MED ORDER — PROPRANOLOL HCL 20 MG PO TABS
20.0000 mg | ORAL_TABLET | Freq: Two times a day (BID) | ORAL | 0 refills | Status: DC | PRN
Start: 2021-01-02 — End: 2021-02-06

## 2021-01-02 MED ORDER — VENLAFAXINE HCL ER 75 MG PO CP24: 225.0000 mg | ORAL_CAPSULE | Freq: Every day | ORAL | 0 refills | Status: DC
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
## 2021-01-13 ENCOUNTER — Encounter: Payer: Self-pay | Admitting: Nurse Practitioner

## 2021-01-14 NOTE — Telephone Encounter (Signed)
Called and left message for patient to see his mychart message and let us know if he has any questions

## 2021-01-15 NOTE — Telephone Encounter (Signed)
Patient has read both mychart messages per computer. He has not called back or scheduled follow up

## 2021-01-29 ENCOUNTER — Other Ambulatory Visit: Payer: Self-pay

## 2021-01-29 ENCOUNTER — Encounter: Payer: Self-pay | Admitting: Family Medicine

## 2021-01-29 ENCOUNTER — Ambulatory Visit (INDEPENDENT_AMBULATORY_CARE_PROVIDER_SITE_OTHER): Payer: Managed Care, Other (non HMO) | Admitting: Family Medicine

## 2021-01-29 VITALS — BP 118/72 | HR 84 | Temp 97.7°F | Ht 71.0 in | Wt 196.0 lb

## 2021-01-29 DIAGNOSIS — R6884 Jaw pain: Secondary | ICD-10-CM | POA: Diagnosis not present

## 2021-01-29 DIAGNOSIS — Z23 Encounter for immunization: Secondary | ICD-10-CM

## 2021-01-29 MED ORDER — IBUPROFEN 600 MG PO TABS: 600.0000 mg | ORAL_TABLET | Freq: Three times a day (TID) | ORAL | 0 refills | Status: DC | PRN

## 2021-01-29 MED ORDER — AMOXICILLIN 875 MG PO TABS: 875.0000 mg | ORAL_TABLET | Freq: Two times a day (BID) | ORAL | 0 refills | Status: DC

## 2021-01-29 NOTE — Assessment & Plan Note (Signed)
3d h/o R sided jaw pain with normal ear exam, no pain at TMJ. Anticipate pain/swelling stemming from impacted 3rd molar. Advised he call dentist for further evaluation. In interim, will Rx ibuprofen 600mg  TID with meals PRN as well as short amoxicillin course given inflammation/swelling present.

## 2021-01-29 NOTE — Progress Notes (Signed)
Patient ID: Ian Lamb, male    DOB: 2000/11/12, 20 y.o.   MRN: [id number]  This visit was conducted in person.  BP 118/72   Pulse 84   Temp 97.7 F (36.5 C) (Temporal)   Ht 5\' 11"  (1.803 m)   Wt 196 lb (88.9 kg)   SpO2 98%   BMI 27.34 kg/m    CC: R ear pain  Subjective:   HPI: Ian Lamb is a 20 y.o. male presenting on 01/29/2021 for Ear Pain (C/o right ear pain.  Started 01/26/21, worsening.  Pain occurs when yawning and biting down. )   "03/28/21" 3d h/o R jaw and ear pain, progressively worsening. Worse pain with yawning and biting with teeth. He does grind teeth at night. Has ordered mouth guard - used last night with benefit. Some nasal congestion over the past month ?allergy related. Points to angle of jaw as site of pain.   No fevers/chills, headache, ST.   Last saw dentist 2 wks ago for routine cleaning - monitoring wisdom teeth.   Virtual visit for L ear pain 09/2020 - treated with ofloxacin drops with benefit.   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      Relevant past medical, surgical, family and social history reviewed and updated as indicated. Interim medical history since our last visit reviewed. Allergies and medications reviewed and updated. Outpatient Medications Prior to Visit  Medication Sig Dispense Refill   propranolol (INDERAL) 20 MG tablet Take 1 tablet (20 mg total) by mouth 2 (two) times daily as needed (anxiety). 180 tablet 0   venlafaxine XR (EFFEXOR XR) 75 MG 24 hr capsule Take 3 capsules (225 mg total) by mouth daily. 270 capsule 0   amphetamine-dextroamphetamine (ADDERALL) 10 MG tablet Take 1 tablet (10 mg total) by mouth daily. (Patient not taking: No sig reported) 30 tablet 0   amphetamine-dextroamphetamine (ADDERALL) 10 MG tablet Take 1 tablet (10 mg total) by mouth daily. (Patient not taking: No sig reported) 30 tablet 0   triamcinolone cream (KENALOG) 0.1 % Apply 1 application topically  2 (two) times daily as needed. (Patient not taking: Reported on 01/02/2021) 30 g 1   No facility-administered medications prior to visit.     Per HPI unless specifically indicated in ROS section below Review of Systems  Objective:  BP 118/72   Pulse 84   Temp 97.7 F (36.5 C) (Temporal)   Ht 5\' 11"  (1.803 m)   Wt 196 lb (88.9 kg)   SpO2 98%   BMI 27.34 kg/m   Wt Readings from Last 3 Encounters:  01/29/21 196 lb (88.9 kg)  01/01/21 191 lb (86.6 kg)  09/20/20 180 lb (81.6 kg)      Physical Exam Vitals and nursing note reviewed.  Constitutional:      Appearance: Normal appearance. He is not ill-appearing.  HENT:     Head: Normocephalic and atraumatic.     Comments: R cheek swelling present    Right Ear: Hearing, tympanic membrane, ear canal and external ear normal. There is no impacted cerumen.     Left Ear: Hearing, tympanic membrane, ear canal and external ear normal. There is no impacted cerumen.     Nose: Nose normal. No mucosal edema, congestion or rhinorrhea.     Right Turbinates: Not enlarged or swollen.     Left Turbinates: Not enlarged or swollen.     Right Sinus: No maxillary sinus tenderness or  frontal sinus tenderness.     Left Sinus: No maxillary sinus tenderness or frontal sinus tenderness.     Mouth/Throat:     Mouth: Mucous membranes are moist.     Dentition: Dental tenderness present.     Tongue: No lesions.     Palate: No mass.     Pharynx: Oropharynx is clear. No oropharyngeal exudate or posterior oropharyngeal erythema.     Tonsils: No tonsillar exudate.     Comments: Reproducible point tenderness to palpation at R upper 3rd molar (wisdom tooth) with evidence of eruption out of gum  Eyes:     Extraocular Movements: Extraocular movements intact.     Conjunctiva/sclera: Conjunctivae normal.     Pupils: Pupils are equal, round, and reactive to light.  Musculoskeletal:     Cervical back: Normal range of motion and neck supple. No rigidity.   Lymphadenopathy:     Cervical: No cervical adenopathy.  Skin:    General: Skin is warm and dry.     Findings: No rash.  Neurological:     Mental Status: He is alert.      Results for orders placed or performed in visit on 09/20/17  POCT rapid strep A  Result Value Ref Range   Rapid Strep A Screen Negative Negative    Assessment & Plan:  This visit occurred during the SARS-CoV-2 public health emergency.  Safety protocols were in place, including screening questions prior to the visit, additional usage of staff PPE, and extensive cleaning of exam room while observing appropriate contact time as indicated for disinfecting solutions.   Problem List Items Addressed This Visit     Jaw pain, non-TMJ - Primary    3d h/o R sided jaw pain with normal ear exam, no pain at TMJ. Anticipate pain/swelling stemming from impacted 3rd molar. Advised he call dentist for further evaluation. In interim, will Rx ibuprofen 600mg  TID with meals PRN as well as short amoxicillin course given inflammation/swelling present.       Other Visit Diagnoses     Need for influenza vaccination       Relevant Orders   Flu Vaccine QUAD 65mo+IM (Fluarix, Fluzone & Alfiuria Quad PF) (Completed)        Meds ordered this encounter  Medications   ibuprofen (ADVIL) 600 MG tablet    Sig: Take 1 tablet (600 mg total) by mouth every 8 (eight) hours as needed for moderate pain.    Dispense:  30 tablet    Refill:  0   amoxicillin (AMOXIL) 875 MG tablet    Sig: Take 1 tablet (875 mg total) by mouth 2 (two) times daily.    Dispense:  14 tablet    Refill:  0   Orders Placed This Encounter  Procedures   Flu Vaccine QUAD 30mo+IM (Fluarix, Fluzone & Alfiuria Quad PF)    Patient instructions: Ear is looking ok I think pain is coming from upper right wisdom tooth  - call dentist to follow up . In the meantime, take ibuprofen 600mg  three times a day with meals for pain, take short antibiotic course sent to pharmacy.    Follow up plan: Return if symptoms worsen or fail to improve.  5mo, MD

## 2021-01-29 NOTE — Patient Instructions (Addendum)
Flu shot today  Ear is looking ok I think pain is coming from upper right wisdom tooth  - call dentist to follow up . In the meantime, take ibuprofen 600mg  three times a day with meals for pain, take short antibiotic course sent to pharmacy.   Impacted Molar Molars are the teeth in the back of the mouth. When a molar starts to grow, it can become trapped beneath the gum, or it may only partially come through the gum surface (become impacted). Molars erupt at different times in life. The first set of molars usually erupts at about 46-21 years of age. The second set of molars typically erupts at about 31-65 years of age. The third set of molars usually erupts at about 2-33 years of age. This last set of molars is sometimes referred to as the wisdom teeth. Wisdom teeth are most often affected by this condition, although any molar or set of molars can become impacted. An impacted molar may increase your risk of developing: Infection. Damage to nearby teeth. Growth of fluid-filled sacs (cysts). Long-term (chronic) discomfort. Inflammation of the surrounding gum tissue. What are the causes? Common causes of this condition include: A tooth that is erupting in the wrong direction. A mouth that is too small to accommodate the tooth. This means that there may not be enough space for the molar to grow into. A tooth, cyst, or a mass (tumor) that is blocking the tooth from erupting. What are the signs or symptoms? In many cases, there are no symptoms. Mild symptoms of this condition include: Pain. Inflammation near the impacted tooth or teeth. A bad taste in your mouth. Bad breath. Moderate symptoms of this condition include: A gap between the teeth. A headache or pain in the jaw. Swollen lymph nodes. Severe symptoms of this condition include: A stiff jaw. Difficulty opening your mouth. How is this diagnosed? This condition can be diagnosed with an oral exam and X-rays. How is this treated? This  condition is often treated by removing (extracting) the impacted molar or molars. Other treatment options include: A procedure to remove the gum tissue that covers the impacted molar to clear a path for it to erupt. Attempting to correct the path of eruption. Repositioning the teeth so there is room for the molar to come through. This may be done with orthodontic devices, such as braces. Antibiotic medicines if the impacted molar or set of molars has become infected. Medicines for pain, if needed. Extraction or surgical extraction of the impacted tooth or teeth, especially when there is insufficient room for the tooth to erupt. Treatment may not be needed if you have no symptoms. Talk with your dental care provider about what is best for you. Follow these instructions at home: Medicines Take over-the-counter and prescription medicines only as told by your health care provider. If you were prescribed an antibiotic medicine, take it as told by your health care provider. Do not stop taking the antibiotic even if you start to feel better. Use an antibacterial mouth rinse as told by your health care provider. General instructions Gargle with a mixture of salt and water 3-4 times a day or as needed. To make salt water, completely dissolve -1 tsp (3-6 g) of salt in 1 cup (237 mL) of warm water. If directed, put ice on the painful area. To do this: Put ice in a plastic bag. Place a towel between your skin and the bag. Leave the ice on for 20 minutes, 2-3 times a  day. Remove the ice if your skin turns bright red. This is very important. If you cannot feel pain, heat, or cold, you have a greater risk of damage to the area. Do not use any products that contain nicotine or tobacco for at least 4 weeks before the procedure. These products include cigarettes, chewing tobacco, and vaping devices, such as e-cigarettes. If you need help quitting, ask your health care provider. Keep all follow-up visits. This is  important. Contact a health care provider if: You have a fever or chills. Your pain is worse and is not helped by medicine. You have new symptoms, including: New facial swelling or numbness. Swelling along your gums. Get help right away if: Your symptoms suddenly get worse. You have trouble opening your mouth. You have problems breathing or swallowing. These symptoms may represent a serious problem that is an emergency. Do not wait to see if the symptoms will go away. Get medical help right away. Call your local emergency services (911 in the [place] Do not drive yourself to the hospital. Summary Molars are the teeth in the back of the mouth. When a molar starts to grow, it can become trapped inside the gum, or it may only partially come through the gum surface (become impacted). An impacted molar may increase the risk of infection, damage to nearby teeth, growth of fluid-filled sacs (cysts), chronic pain, inflammation of the surrounding gum tissue, or other dental problems. This condition is often treated by removing (extracting) the impacted molar or molars when there is insufficient room for the tooth to erupt. This information is not intended to replace advice given to you by your health care provider. Make sure you discuss any questions you have with your health care provider. Document Revised: 06/14/2020 Document Reviewed: 06/14/2020 Elsevier Patient Education  2022 [employer].

## 2021-02-06 ENCOUNTER — Telehealth (HOSPITAL_BASED_OUTPATIENT_CLINIC_OR_DEPARTMENT_OTHER): Payer: 59 | Admitting: Psychiatry

## 2021-02-06 ENCOUNTER — Other Ambulatory Visit: Payer: Self-pay

## 2021-02-06 DIAGNOSIS — F900 Attention-deficit hyperactivity disorder, predominantly inattentive type: Secondary | ICD-10-CM

## 2021-02-06 DIAGNOSIS — F411 Generalized anxiety disorder: Secondary | ICD-10-CM

## 2021-02-06 DIAGNOSIS — F324 Major depressive disorder, single episode, in partial remission: Secondary | ICD-10-CM | POA: Diagnosis not present

## 2021-02-06 MED ORDER — VENLAFAXINE HCL ER 75 MG PO CP24: 225.0000 mg | ORAL_CAPSULE | Freq: Every day | ORAL | 0 refills | Status: DC

## 2021-02-06 MED ORDER — AMPHETAMINE-DEXTROAMPHETAMINE 10 MG PO TABS: 10.0000 mg | ORAL_TABLET | Freq: Every day | ORAL | 0 refills | Status: DC

## 2021-02-06 MED ORDER — PROPRANOLOL HCL 20 MG PO TABS: 20.0000 mg | ORAL_TABLET | Freq: Two times a day (BID) | ORAL | 0 refills | Status: DC | PRN

## 2021-02-06 NOTE — Progress Notes (Signed)
Virtual Visit via Video Note  I connected with Ian Lamb on 02/06/21 at  2:30 PM EDT by a video enabled telemedicine application and verified that I am speaking with the correct person using two identifiers.  Location: Patient: work Provider: office   I discussed the limitations of evaluation and management by telemedicine and the availability of in person appointments. The patient expressed understanding and agreed to proceed.  History of Present Illness: Ian Lamb is doing well. He is staying busy and is working at his [employer]. It is going well. He was not able to start Adderall due to the national shortage. He is having trouble concentration and often "zone's out". Ian Lamb has not had many depressed days over the last 2 weeks. It has only been 1-2 days. His anxiety is not as intense. It is related to situational stressors and often related to his relationship problems. Propranolol remains beneficial and he takes it daily when angry or upset. He denies SI/HI. He has been on himself and he is feeling better.    Observations/Objective: Psychiatric Specialty Exam: ROS  There were no vitals taken for this visit.There is no height or weight on file to calculate BMI.  General Appearance: Casual and Neat  Eye Contact:  Good  Speech:  Clear and Coherent and Slow  Volume:  Normal  Mood:  Euthymic  Affect:  Full Range  Thought Process:  Goal Directed, Linear, and Descriptions of Associations: Intact  Orientation:  Full (Time, Place, and Person)  Thought Content:  Logical  Suicidal Thoughts:  No  Homicidal Thoughts:  No  Memory:  Immediate;   Good  Judgement:  Good  Insight:  Good  Psychomotor Activity:  Normal  Concentration:  Concentration: Good  Recall:  Good  Fund of Knowledge:  Good  Language:  Good  Akathisia:  No  Handed:  Right  AIMS (if indicated):     Assets:  Communication Skills Desire for Improvement Financial Resources/Insurance Housing Leisure  Time Physical Health Resilience Social Support Talents/Skills Transportation Vocational/Educational  ADL's:  Intact  Cognition:  WNL  Sleep:        Assessment and Plan: Depression screen Ian Lamb 2/9 02/06/2021 11/28/2020 08/29/2020 08/29/2020 12/30/2016  Decreased Interest 0 1 0 0 0  Down, Depressed, Hopeless 1 1 2 1 2   PHQ - 2 Score 1 2 2 1 2   Altered sleeping - 1 2 2 1   Tired, decreased energy - 0 0 1 1  Change in appetite - 0 1 1 1   Feeling bad or failure about yourself  - 2 3 - 3  Trouble concentrating - 3 1 1 3   Moving slowly or fidgety/restless - 0 0 0 1  Suicidal thoughts - 0 0 - 0  PHQ-9 Score - 8 9 6 12   Difficult doing work/chores - Very difficult Very difficult - Extremely dIfficult    Flowsheet Row Video Visit [facility]/28/2020 in [facility] Video Visit from 08/29/2020 in [facility]  C-SSRS RISK CATEGORY No Risk No Risk        1. Attention deficit hyperactivity disorder (ADHD), predominantly inattentive type - amphetamine-dextroamphetamine (ADDERALL) 10 MG tablet; Take 1 tablet (10 mg total) by mouth daily.  Dispense: 30 tablet; Refill: 0 - amphetamine-dextroamphetamine (ADDERALL) 10 MG tablet; Take 1 tablet (10 mg total) by mouth daily.  Dispense: 30 tablet; Refill: 0  2. GAD (generalized anxiety disorder) - propranolol (INDERAL) 20 MG tablet; Take 1 tablet (20 mg total) by  mouth 2 (two) times daily as needed (anxiety).  Dispense: 180 tablet; Refill: 0 - venlafaxine XR (EFFEXOR XR) 75 MG 24 hr capsule; Take 3 capsules (225 mg total) by mouth daily.  Dispense: 270 capsule; Refill: 0  3. Major depressive disorder with single episode, in partial remission (HCC) - venlafaxine XR (EFFEXOR XR) 75 MG 24 hr capsule; Take 3 capsules (225 mg total) by mouth daily.  Dispense: 270 capsule; Refill: 0   Follow Up Instructions: In 2-3 months or sooner if needed   I discussed the assessment and  treatment plan with the patient. The patient was provided an opportunity to ask questions and all were answered. The patient agreed with the plan and demonstrated an understanding of the instructions.   The patient was advised to call back or seek an in-person evaluation if the symptoms worsen or if the condition fails to improve as anticipated.  I provided 9 minutes of non-face-to-face time during this encounter.   Oletta Darter, MD

## 2021-04-03 ENCOUNTER — Other Ambulatory Visit (HOSPITAL_COMMUNITY): Payer: Self-pay | Admitting: Psychiatry

## 2021-04-03 ENCOUNTER — Telehealth (HOSPITAL_COMMUNITY): Payer: Self-pay | Admitting: *Deleted

## 2021-04-03 DIAGNOSIS — F900 Attention-deficit hyperactivity disorder, predominantly inattentive type: Secondary | ICD-10-CM

## 2021-04-03 MED ORDER — AMPHETAMINE-DEXTROAMPHETAMINE 10 MG PO TABS: 10.0000 mg | ORAL_TABLET | Freq: Every day | ORAL | 0 refills | Status: DC

## 2021-04-03 NOTE — Telephone Encounter (Signed)
Pt mother, Thurston Hole, called requesting Adderall script be sent to [facility] [phone] [facility] and [address] as the [facility] on [address] and [address] is still out of this medication. Pt has an appointment scheduled for 05/01/20.

## 2021-05-01 ENCOUNTER — Telehealth (HOSPITAL_BASED_OUTPATIENT_CLINIC_OR_DEPARTMENT_OTHER): Payer: 59 | Admitting: Psychiatry

## 2021-05-01 ENCOUNTER — Other Ambulatory Visit: Payer: Self-pay

## 2021-05-01 ENCOUNTER — Encounter (HOSPITAL_COMMUNITY): Payer: Self-pay | Admitting: Psychiatry

## 2021-05-01 DIAGNOSIS — F900 Attention-deficit hyperactivity disorder, predominantly inattentive type: Secondary | ICD-10-CM

## 2021-05-01 DIAGNOSIS — F324 Major depressive disorder, single episode, in partial remission: Secondary | ICD-10-CM | POA: Diagnosis not present

## 2021-05-01 DIAGNOSIS — F411 Generalized anxiety disorder: Secondary | ICD-10-CM | POA: Diagnosis not present

## 2021-05-01 MED ORDER — VENLAFAXINE HCL ER 75 MG PO CP24: 225.0000 mg | ORAL_CAPSULE | Freq: Every day | ORAL | 0 refills | Status: DC

## 2021-05-01 MED ORDER — AMPHETAMINE-DEXTROAMPHETAMINE 10 MG PO TABS: 10.0000 mg | ORAL_TABLET | Freq: Every day | ORAL | 0 refills | Status: DC

## 2021-05-01 MED ORDER — PROPRANOLOL HCL 20 MG PO TABS: 20.0000 mg | ORAL_TABLET | Freq: Two times a day (BID) | ORAL | 0 refills | Status: DC | PRN
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
## 2021-05-13 ENCOUNTER — Other Ambulatory Visit (HOSPITAL_COMMUNITY): Payer: Self-pay | Admitting: *Deleted

## 2021-05-13 DIAGNOSIS — F324 Major depressive disorder, single episode, in partial remission: Secondary | ICD-10-CM

## 2021-05-13 DIAGNOSIS — F411 Generalized anxiety disorder: Secondary | ICD-10-CM

## 2021-05-13 MED ORDER — PROPRANOLOL HCL 20 MG PO TABS: 20.0000 mg | ORAL_TABLET | Freq: Two times a day (BID) | ORAL | 0 refills | Status: DC | PRN

## 2021-05-13 MED ORDER — VENLAFAXINE HCL ER 75 MG PO CP24: 225.0000 mg | ORAL_CAPSULE | Freq: Every day | ORAL | 0 refills | Status: DC

## 2021-05-25 ENCOUNTER — Encounter: Payer: Self-pay | Admitting: Family Medicine

## 2021-05-26 ENCOUNTER — Ambulatory Visit (INDEPENDENT_AMBULATORY_CARE_PROVIDER_SITE_OTHER): Payer: Managed Care, Other (non HMO) | Admitting: Family Medicine

## 2021-05-26 ENCOUNTER — Other Ambulatory Visit: Payer: Self-pay

## 2021-05-26 ENCOUNTER — Encounter: Payer: Self-pay | Admitting: Family Medicine

## 2021-05-26 VITALS — BP 116/78 | HR 95 | Temp 97.7°F | Ht 71.0 in | Wt 192.0 lb

## 2021-05-26 DIAGNOSIS — T23229A Burn of second degree of unspecified single finger (nail) except thumb, initial encounter: Secondary | ICD-10-CM

## 2021-05-26 DIAGNOSIS — R55 Syncope and collapse: Secondary | ICD-10-CM

## 2021-05-26 NOTE — Patient Instructions (Addendum)
Keep the area clean and covered with a nonstick bandage.   Go to the lab on the way out.   If you have mychart we'll likely use that to update you.    Drink enough water to keep your urine clear or light yellow.  Update me as needed.  Take care.  Glad to see you.
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
## 2021-05-27 ENCOUNTER — Ambulatory Visit: Payer: Managed Care, Other (non HMO) | Admitting: Family Medicine

## 2021-05-27 LAB — TSH: TSH: 1.86 u[IU]/mL (ref 0.35–5.50)

## 2021-05-27 LAB — CBC WITH DIFFERENTIAL/PLATELET
Basophils Absolute: 0 10*3/uL (ref 0.0–0.1)
Basophils Relative: 0.3 % (ref 0.0–3.0)
Eosinophils Absolute: 0.1 10*3/uL (ref 0.0–0.7)
Eosinophils Relative: 2.5 % (ref 0.0–5.0)
HCT: 44.3 % (ref 39.0–52.0)
Hemoglobin: 14.9 g/dL (ref 13.0–17.0)
Lymphocytes Relative: 30.5 % (ref 12.0–46.0)
Lymphs Abs: 1.8 10*3/uL (ref 0.7–4.0)
MCHC: 33.6 g/dL (ref 30.0–36.0)
MCV: 90.8 fl (ref 78.0–100.0)
Monocytes Absolute: 0.5 10*3/uL (ref 0.1–1.0)
Monocytes Relative: 9.2 % (ref 3.0–12.0)
Neutro Abs: 3.4 10*3/uL (ref 1.4–7.7)
Neutrophils Relative %: 57.5 % (ref 43.0–77.0)
Platelets: 304 10*3/uL (ref 150.0–400.0)
RBC: 4.87 Mil/uL (ref 4.22–5.81)
RDW: 12.9 % (ref 11.5–15.5)
WBC: 5.9 10*3/uL (ref 4.0–10.5)

## 2021-05-27 LAB — BASIC METABOLIC PANEL
BUN: 15 mg/dL (ref 6–23)
CO2: 31 mEq/L (ref 19–32)
Calcium: 9.5 mg/dL (ref 8.4–10.5)
Chloride: 102 mEq/L (ref 96–112)
Creatinine, Ser: 0.99 mg/dL (ref 0.40–1.50)
GFR: 108.63 mL/min (ref >60.00)
Glucose, Bld: 74 mg/dL (ref 70–99)
Potassium: 4.6 mEq/L (ref 3.5–5.1)
Sodium: 137 mEq/L (ref 135–145)

## 2021-05-29 DIAGNOSIS — T23229A Burn of second degree of unspecified single finger (nail) except thumb, initial encounter: Secondary | ICD-10-CM | POA: Insufficient documentation
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
## 2021-05-29 NOTE — Assessment & Plan Note (Signed)
With no symptoms in the meantime and high exercise tolerance at baseline.  No murmur.  EKG without acute changes.  He had not had very much fluid intake that day and he was working in a hot kitchen.  That all in combination with his propranolol use likely caused relative decrease in blood pressure causing him to become syncopal.  He did have awareness of the event i.e. prodromal symptoms, making dysrhythmia less likely.  We will check routine labs today.  If his labs are unremarkable, management would be to make sure he is drinking plenty of fluid.  Discussed.  He understood.  He can update me as needed.

## 2021-07-10 ENCOUNTER — Encounter: Payer: Self-pay | Admitting: Family Medicine

## 2021-07-11 ENCOUNTER — Ambulatory Visit (INDEPENDENT_AMBULATORY_CARE_PROVIDER_SITE_OTHER): Payer: Managed Care, Other (non HMO) | Admitting: Nurse Practitioner

## 2021-07-11 ENCOUNTER — Ambulatory Visit (INDEPENDENT_AMBULATORY_CARE_PROVIDER_SITE_OTHER)
Admission: RE | Admit: 2021-07-11 | Discharge: 2021-07-11 | Disposition: A | Discharge status: Home / Self Care | Payer: Managed Care, Other (non HMO) | Source: Ambulatory Visit | Attending: Nurse Practitioner | Admitting: Nurse Practitioner

## 2021-07-11 ENCOUNTER — Other Ambulatory Visit: Payer: Self-pay

## 2021-07-11 VITALS — BP 100/82 | HR 100 | Temp 97.6°F | Resp 14 | Wt 190.0 lb

## 2021-07-11 DIAGNOSIS — R0781 Pleurodynia: Secondary | ICD-10-CM

## 2021-07-11 DIAGNOSIS — K921 Melena: Secondary | ICD-10-CM | POA: Insufficient documentation

## 2021-07-11 DIAGNOSIS — R197 Diarrhea, unspecified: Secondary | ICD-10-CM | POA: Diagnosis not present

## 2021-07-11 LAB — HEMOCCULT GUIAC POC 1CARD (OFFICE): Fecal Occult Blood, POC: POSITIVE — AB

## 2021-07-11 MED ORDER — HYDROCORTISONE ACETATE 25 MG RE SUPP: 25.0000 mg | Freq: Two times a day (BID) | RECTAL | 0 refills | Status: DC

## 2021-07-11 NOTE — Assessment & Plan Note (Signed)
Had an episode of diarrhea that had blood in it.  Do not feel like this is related in regards to he was having hematochezia for approximately 2 weeks prior to this.  Patient states he is lactose intolerant but will eat cheese because he rubs it and it will cause diarrhea. ?

## 2021-07-11 NOTE — Telephone Encounter (Signed)
Noted will see him in office 
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
## 2021-07-11 NOTE — Assessment & Plan Note (Signed)
No gross blood appreciated on rectal exam.  Hemoccult was positive in office.  Did not appreciate external hemorrhoid, anal fissure, internal hemorrhoid.  Patient had a normal rectal tone.  We will give patient some Anusol suppositories to try while waiting for blood work and referral to GI in case hemorrhoid was not palpated.  Pending basic blood work.  Ambulatory referral to GI placed today ?

## 2021-07-11 NOTE — Progress Notes (Signed)
? ?Acute Office Visit ? ?Subjective:  ? ? Patient ID: Ian Lamb, male    DOB: 06-18-2000, 21 y.o.   MRN: [id number] ? ?Chief Complaint  ?Patient presents with  ? Diarrhea  ?  starting 2 1/2 wks ago pt started with rectal bleeding that is bright red on tissue and in commode water. Pt has also had watery type diarrhea and in last 24 hrs had 3 - 4 watery stools with blood. No abdominal pain or vomiting. No fever.   ? ? ? ?Patient is in today for Rectal bleeding ? ?States that bright red blood on tissue paper and in the water. States that it is intermittent. States that the water will be red and the tissue will be soaked. ?States that before now he would have small pellet size stools then normal stool and then diarrhea. States that diet will cause diarrhea. He is lactose intolerant but loves cheese.  ?No history of hemorrhoids or injury or objects inserted into rectum per patient repot ?States that it feels irrated with wiping with tissue. Has not tried any over the counter medications ?States he will take ibuprofetn once or twice a week but no consistent use. ? ?States that he hurt his ribs left rib. He states he fell. Has improved some since he injured. States that he has fallen asleep on it and is a rough sleeper. Has  taken ibuprofren intermittently with some help ? ?Past Medical History:  ?Diagnosis Date  ? Abscess 2011  ? Left calf  ? ADD (attention deficit disorder)   ? Anxiety   ? Depression   ? ? ?Past Surgical History:  ?Procedure Laterality Date  ? MOUTH SURGERY    ? in childhood  ? NO PAST SURGERIES    ? ? ?Family History  ?Problem Relation Age of Onset  ? Bipolar disorder Maternal Uncle   ? ADD / ADHD Neg Hx   ?     Positive family history  ? ? ?Social History  ? ?Socioeconomic History  ? Marital status: Single  ?  Spouse name: Not on file  ? Number of children: 0  ? Years of education: 75  ? Highest education level: Not on file  ?Occupational History  ? Not on file  ?Tobacco Use  ? Smoking status:  Every Day  ?  Types: E-cigarettes  ? Smokeless tobacco: Former  ?  Types: Chew  ?  Quit date: 10/01/2020  ? Tobacco comments:  ?  vaping daily  ?Vaping Use  ? Vaping Use: Every day  ?Substance and Sexual Activity  ? Alcohol use: Yes  ?  Alcohol/week: 1.0 standard drink  ?  Types: 1 Shots of liquor per week  ?  Comment: rarely  ? Drug use: Yes  ?  Frequency: 7.0 times per week  ?  Types: Marijuana  ? Sexual activity: Yes  ?  Birth control/protection: Condom  ?Other Topics Concern  ? Not on file  ?Social History Narrative  ? Parents divorced.  Lives with mother and step dad. He gets along with his mom and step dad.  [employer] and will be starting 12 grade in Aug 2019  ?   ? Father travels due to work and lives in [place], [place]. Pt see's him every 2 weeks. Pt looks up to his biological dad a lot.  ?   ? Pt is an only child but has a lot of cousins  ?   ? Denies legal hx  ?   ?  Play track and lacrosse. He likes swimming.  ? ?Social Determinants of Health  ? ?Financial Resource Strain: Not on file  ?Food Insecurity: Not on file  ?Transportation Needs: Not on file  ?Physical Activity: Not on file  ?Stress: Not on file  ?Social Connections: Not on file  ?Intimate Partner Violence: Not on file  ? ? ?Outpatient Medications Prior to Visit  ?Medication Sig Dispense Refill  ? amphetamine-dextroamphetamine (ADDERALL) 10 MG tablet Take 1 tablet (10 mg total) by mouth daily. 30 tablet 0  ? propranolol (INDERAL) 20 MG tablet Take 1 tablet (20 mg total) by mouth 2 (two) times daily as needed (anxiety). 180 tablet 0  ? venlafaxine XR (EFFEXOR XR) 75 MG 24 hr capsule Take 3 capsules (225 mg total) by mouth daily. 270 capsule 0  ? ?No facility-administered medications prior to visit.  ? ? ?Allergies  ?Allergen Reactions  ? Sulfamethoxazole-Trimethoprim   ?  REACTION: rash 2011  ? Watermelon [Citrullus Vulgaris] Itching  ? ? ?Review of Systems  ?Constitutional:  Negative for chills and fever.  ?Respiratory:  Negative for shortness  of breath.   ?Cardiovascular:  Positive for chest pain (pain from fall).  ?Gastrointestinal:  Positive for anal bleeding, blood in stool and diarrhea. Negative for abdominal pain, constipation, nausea and vomiting.  ?Neurological:  Negative for dizziness and light-headedness.  ? ?   ?Objective:  ?  ?Physical Exam ?Vitals and nursing note reviewed. Exam conducted with a chaperone present Beatriz Stallion, CMA).  ?Constitutional:   ?   Appearance: Normal appearance.  ?Cardiovascular:  ?   Rate and Rhythm: Normal rate and regular rhythm.  ?   Heart sounds: Normal heart sounds.  ?Pulmonary:  ?   Effort: Pulmonary effort is normal.  ?   Breath sounds: Normal breath sounds.  ?Chest:  ?   Chest wall: Tenderness present.  ?Abdominal:  ?   General: Bowel sounds are normal. There is no distension.  ?   Palpations: There is no mass.  ?   Tenderness: There is no abdominal tenderness.  ?   Hernia: No hernia is present.  ?Genitourinary: ?   Rectum: Guaiac result positive. No mass, tenderness or anal fissure. Normal anal tone.  ?Skin: ?   General: Skin is warm.  ?   Coloration: Skin is not pale.  ?Neurological:  ?   Mental Status: He is alert.  ? ? ?BP 100/82   Pulse 100   Temp 97.6 ?F (36.4 ?C)   Resp 14   Wt 190 lb (86.2 kg)   SpO2 97%   BMI 26.50 kg/m?  ?Wt Readings from Last 3 Encounters:  ?07/11/21 190 lb (86.2 kg)  ?05/26/21 192 lb (87.1 kg)  ?01/29/21 196 lb (88.9 kg)  ? ? ?Health Maintenance Due  ?Topic Date Due  ? HPV VACCINES (1 - Male 2-dose series) Never done  ? HIV Screening  Never done  ? Hepatitis C Screening  Never done  ? COVID-19 Vaccine (4 - Booster for Pfizer series) 06/03/2020  ? ? ?   ?Topic Date Due  ? HPV VACCINES (1 - Male 2-dose series) Never done  ? ? ? ?Lab Results  ?Component Value Date  ? TSH 1.86 05/26/2021  ? ?Lab Results  ?Component Value Date  ? WBC 5.9 05/26/2021  ? HGB 14.9 05/26/2021  ? HCT 44.3 05/26/2021  ? MCV 90.8 05/26/2021  ? PLT 304.0 05/26/2021  ? ?Lab Results  ?Component Value  Date  ? NA 137 05/26/2021  ?  K 4.6 05/26/2021  ? CO2 31 05/26/2021  ? GLUCOSE 74 05/26/2021  ? BUN 15 05/26/2021  ? CREATININE 0.99 05/26/2021  ? CALCIUM 9.5 05/26/2021  ? GFR 108.63 05/26/2021  ? ?No results found for: CHOL ?No results found for: HDL ?No results found for: [place] ?No results found for: TRIG ?No results found for: CHOLHDL ?No results found for: HGBA1C ? ?   ?Assessment & Plan:  ? ?Problem List Items Addressed This Visit   ? ?  ? Other  ? Rib pain  ?  Patient states that his cousins there to him to climb something which she fell striking his chest has been having some left anterior near sternal lower rib pain on palpation.  We will obtain an chest x-ray for further evaluation.  Continue over-the-counter analgesics as needed ?  ?  ? Relevant Orders  ? DG Chest 2 View  ? Hematochezia - Primary  ?  No gross blood appreciated on rectal exam.  Hemoccult was positive in office.  Did not appreciate external hemorrhoid, anal fissure, internal hemorrhoid.  Patient had a normal rectal tone.  We will give patient some Anusol suppositories to try while waiting for blood work and referral to GI in case hemorrhoid was not palpated.  Pending basic blood work.  Ambulatory referral to GI placed today ?  ?  ? Relevant Medications  ? hydrocortisone (ANUSOL-HC) 25 MG suppository  ? Other Relevant Orders  ? Hemoccult - 1 Card (office) (Completed)  ? CBC  ? Comprehensive metabolic panel  ? Ambulatory referral to Gastroenterology  ? Diarrhea  ?  Had an episode of diarrhea that had blood in it.  Do not feel like this is related in regards to he was having hematochezia for approximately 2 weeks prior to this.  Patient states he is lactose intolerant but will eat cheese because he rubs it and it will cause diarrhea. ?  ?  ? Relevant Orders  ? CBC  ? Comprehensive metabolic panel  ? ? ? ?Meds ordered this encounter  ?Medications  ? hydrocortisone (ANUSOL-HC) 25 MG suppository  ?  Sig: Place 1 suppository (25 mg total)  rectally 2 (two) times daily.  ?  Dispense:  12 suppository  ?  Refill:  0  ?  Order Specific Question:   Supervising Provider  ?  Answer:   Loura Pardon A [[id number]]  ? ?This visit occurred during the SARS-CoV-2 public h

## 2021-07-11 NOTE — Patient Instructions (Signed)
Nice to see you today ?I will follow with you once I have the results ?Follow up if symptoms get worse or do not improve ?

## 2021-07-11 NOTE — Telephone Encounter (Signed)
I spoke with pt;starting 2 1/2 wks ago pt started with rectal bleeding that is bright red on tissue and in commode water.pt is not sure if has hemorrhoid or not. Pt has also had watery type diarrhea and in last 24 hrs had 3 - 4 watery stools with blood. Pt does not have any fever or abd pain. Pt has had head congestion with allergies but no other covid symptoms. Pt said when has BM it "smells weird", no further explanation could be given. Pt scheduled appt with Romilda Garret NP 07/11/21 at 3 PM. UC & ED precautions given and pt voiced understanding. Sending note to Romilda Garret NP and Anastasiya CMA. ?

## 2021-07-12 LAB — CBC
HCT: 44.1 % (ref 38.5–50.0)
Hemoglobin: 15.1 g/dL (ref 13.2–17.1)
MCH: 31.4 pg (ref 27.0–33.0)
MCHC: 34.2 g/dL (ref 32.0–36.0)
MCV: 91.7 fL (ref 80.0–100.0)
MPV: 9.7 fL (ref 7.5–12.5)
Platelets: 266 10*3/uL (ref 140–400)
RBC: 4.81 10*6/uL (ref 4.20–5.80)
RDW: 12.4 % (ref 11.0–15.0)
WBC: 5.6 10*3/uL (ref 3.8–10.8)

## 2021-07-12 LAB — COMPREHENSIVE METABOLIC PANEL
AG Ratio: 2 (calc) (ref 1.0–2.5)
ALT: 21 U/L (ref 9–46)
AST: 22 U/L (ref 10–40)
Albumin: 4.5 g/dL (ref 3.6–5.1)
Alkaline phosphatase (APISO): 70 U/L (ref 36–130)
BUN: 18 mg/dL (ref 7–25)
CO2: 23 mmol/L (ref 20–32)
Calcium: 9.6 mg/dL (ref 8.6–10.3)
Chloride: 104 mmol/L (ref 98–110)
Creat: 1.04 mg/dL (ref 0.60–1.24)
Globulin: 2.3 g/dL (calc) (ref 1.9–3.7)
Glucose, Bld: 84 mg/dL (ref 65–99)
Potassium: 4.3 mmol/L (ref 3.5–5.3)
Sodium: 138 mmol/L (ref 135–146)
Total Bilirubin: 0.8 mg/dL (ref 0.2–1.2)
Total Protein: 6.8 g/dL (ref 6.1–8.1)

## 2021-07-14 ENCOUNTER — Telehealth (HOSPITAL_COMMUNITY): Payer: Self-pay | Admitting: *Deleted

## 2021-07-14 NOTE — Telephone Encounter (Signed)
Message received from pt mother, Thurston Hole, asking if you would consider prescribing Seroquel at HS. Pt has an upcoming appointment on 07/2021.   ?

## 2021-07-18 ENCOUNTER — Ambulatory Visit: Payer: Managed Care, Other (non HMO) | Admitting: Family Medicine

## 2021-07-24 NOTE — Telephone Encounter (Signed)
I left another VM for pt enocuraging him to call this nurse with regards to the Seroquel.

## 2021-08-07 ENCOUNTER — Telehealth (HOSPITAL_BASED_OUTPATIENT_CLINIC_OR_DEPARTMENT_OTHER): Payer: 59 | Admitting: Psychiatry

## 2021-08-07 ENCOUNTER — Telehealth (HOSPITAL_COMMUNITY): Payer: Self-pay

## 2021-08-07 DIAGNOSIS — F411 Generalized anxiety disorder: Secondary | ICD-10-CM

## 2021-08-07 DIAGNOSIS — F99 Mental disorder, not otherwise specified: Secondary | ICD-10-CM

## 2021-08-07 DIAGNOSIS — F900 Attention-deficit hyperactivity disorder, predominantly inattentive type: Secondary | ICD-10-CM

## 2021-08-07 DIAGNOSIS — F5105 Insomnia due to other mental disorder: Secondary | ICD-10-CM | POA: Diagnosis not present

## 2021-08-07 DIAGNOSIS — F324 Major depressive disorder, single episode, in partial remission: Secondary | ICD-10-CM | POA: Diagnosis not present

## 2021-08-07 MED ORDER — PROPRANOLOL HCL 20 MG PO TABS: 20.0000 mg | ORAL_TABLET | Freq: Two times a day (BID) | ORAL | 0 refills | Status: DC | PRN

## 2021-08-07 MED ORDER — AMPHETAMINE-DEXTROAMPHETAMINE 10 MG PO TABS: 10.0000 mg | ORAL_TABLET | Freq: Two times a day (BID) | ORAL | 0 refills | Status: DC

## 2021-08-07 MED ORDER — TRAZODONE HCL 50 MG PO TABS: 50.0000 mg | ORAL_TABLET | Freq: Every evening | ORAL | 1 refills | Status: DC | PRN

## 2021-08-07 MED ORDER — VENLAFAXINE HCL ER 75 MG PO CP24: 225.0000 mg | ORAL_CAPSULE | Freq: Every day | ORAL | 0 refills | Status: DC

## 2021-08-07 NOTE — Telephone Encounter (Signed)
Patient's mom called regarding the Trazodone 50mg  that was prescribed at today's appointment. She stated that she doesn't want him to be on that due to family genetics and is not going to pick it up. Mom also stated that she has been requesting patient try Seroquel and left message on 3/27 to find out if it would be a good fit or not before appt today. Mom stated that she had left her phone # to call. If provider doesn't agree with pt trying Seroquel,  mom wants to know why pt can't be put on it. Mom's phone number is [phone]. Please review and advise. Thank you ?

## 2021-08-07 NOTE — Progress Notes (Signed)
Virtual Visit via Video Note ? ?I connected with Ian Lamb on 08/07/21 at  1:30 PM EDT by a video enabled telemedicine application and verified that I am speaking with the correct person using two identifiers. ? ?Location: ?Patient: home ?Provider: office ?  ?I discussed the limitations of evaluation and management by telemedicine and the availability of in person appointments. The patient expressed understanding and agreed to proceed. ? ?History of Present Illness: ?Ian Lamb shares that he is "doing pretty good". He may need to go up by 5-10mg  of Adderall for longer effect. He is taking Adderall about 3-4 days of the week. It seems to get out of his system by 75 min. After this he zones out and has trouble finishing tasks. His anxiety also increases and then has go to the bathroom. Ian Lamb notices that his stress tolerance decreased and he gets angry easily. This frustrated him and he will hold onto something or punch something soft and then he feels better. He then takes the Propranolol BID and it helps to calm him. Ian Lamb has been eating better and his stomach is feeling better. He is eating at least 2 meals/day.  Ian Lamb is making an effort to talk to people and be more active. He has cut out some toxic friends and things have gotten better. He was depressed about 1 week ago for 2 days. He was sitting around all day and had no motivation to do anything. Ian Lamb's sleep schedule is off. On good days he is sleeping by 10pm. On bad days he goes to be somewhere between 1am-6am because his anxiety is high.  This happens 1-2x/week or less. In total he gets about 7-8 hrs/night. He still has a lot of negative self thoughts. He is very critical and hard on myself. Ian Lamb denies SI/HI. 1 month ago he was not on his meds and had some thoughts like "if my family was not here then I would not be around anymore". Ian Lamb is interested in starting Seroquel for sleep. Several of his friends are on it and they have a good schedule. ?   ?Observations/Objective: ?Psychiatric Specialty Exam: ?ROS  ?There were no vitals taken for this visit.There is no height or weight on file to calculate BMI.  ?General Appearance: Casual and Neat  ?Eye Contact:  Good  ?Speech:  Clear and Coherent and Normal Rate  ?Volume:  Normal  ?Mood:  Anxious and Depressed  ?Affect:  Full Range  ?Thought Process:  Goal Directed, Linear, and Descriptions of Associations: Intact  ?Orientation:  Full (Time, Place, and Person)  ?Thought Content:  Logical  ?Suicidal Thoughts:  No  ?Homicidal Thoughts:  No  ?Memory:  Immediate;   Good  ?Judgement:  Good  ?Insight:  Good  ?Psychomotor Activity:  Normal  ?Concentration:  Concentration: Good  ?Recall:  Good  ?Fund of Knowledge:  Good  ?Language:  Good  ?Akathisia:  No  ?Handed:  Right  ?AIMS (if indicated):     ?Assets:  Communication Skills ?Desire for Improvement ?Financial Resources/Insurance ?Housing ?Leisure Time ?Physical Health ?Resilience ?Social Support ?Talents/Skills ?Transportation ?Vocational/Educational  ?ADL's:  Intact  ?Cognition:  WNL  ?Sleep:     ? ? ? ?Assessment and Plan: ? ? ?  08/07/2021  ?  1:44 PM 05/01/2021  ?  2:46 PM 05/01/2021  ?  2:42 PM 02/06/2021  ?  2:38 PM 11/28/2020  ?  4:31 PM  ?Depression screen PHQ 2/9  ?Decreased Interest 1 0 0 0 1  ?Down, Depressed,  Hopeless 1 3 1 1 1   ?PHQ - 2 Score 2 3 1 1 2   ?Altered sleeping 1 0   1  ?Tired, decreased energy 1 0   0  ?Change in appetite 0 0   0  ?Feeling bad or failure about yourself  2 3   2   ?Trouble concentrating 3 1   3   ?Moving slowly or fidgety/restless 0 0   0  ?Suicidal thoughts 0    0  ?PHQ-9 Score 9 7   8   ?Difficult doing work/chores Very difficult Somewhat difficult   Very difficult  ? ? ?Flowsheet Row Video Visit from 08/07/2021 in [facility] Video Visit from 05/01/2021 in [facility] Video Visit from 11/28/2020 in [facility]  ?C-SSRS  RISK CATEGORY No Risk No Risk No Risk  ? ?  ? ? ?Status of current problems: worsening ADHD, ongoing depression and anxiety ? ?Meds:  ?1. Attention deficit hyperactivity disorder (ADHD), predominantly inattentive type ?- amphetamine-dextroamphetamine (ADDERALL) 10 MG tablet; Take 1 tablet (10 mg total) by mouth 2 (two) times daily with a meal.  Dispense: 60 tablet; Refill: 0 ? ?2. GAD (generalized anxiety disorder) ?- venlafaxine XR (EFFEXOR XR) 75 MG 24 hr capsule; Take 3 capsules (225 mg total) by mouth daily.  Dispense: 270 capsule; Refill: 0 ?- propranolol (INDERAL) 20 MG tablet; Take 1 tablet (20 mg total) by mouth 2 (two) times daily as needed (anxiety).  Dispense: 180 tablet; Refill: 0 ? ?3. Insomnia due to other mental disorder ?- traZODone (DESYREL) 50 MG tablet; Take 1 tablet (50 mg total) by mouth at bedtime as needed for sleep.  Dispense: 30 tablet; Refill: 1 ? ?4. Major depressive disorder with single episode, in partial remission (HCC) ?- venlafaxine XR (EFFEXOR XR) 75 MG 24 hr capsule; Take 3 capsules (225 mg total) by mouth daily.  Dispense: 270 capsule; Refill: 0 ? ? ?-declined his request to start Seroquel for sleep ?- start Trazodone prn for sleep ?- increase Adderall 10mg  BID for longer effect ? ?Going to therapy once/week ? ? ? ? ?Therapy: brief supportive therapy provided. Discussed psychosocial stressors in detail.  ? ?Collaboration of Care: Referral or follow-up with counselor/therapist AEB qweekly per patient ? ?Patient/Guardian was advised Release of Information must be obtained prior to any record release in order to collaborate their care with an outside provider. Patient/Guardian was advised if they have not already done so to contact the registration department to sign all necessary forms in order for to release information regarding their care.  ? ?Consent: Patient/Guardian gives verbal consent for treatment and assignment of benefits for services provided during this visit.  Patient/Guardian expressed understanding and agreed to proceed.  ? ? ? ? ?Follow Up Instructions: ?Follow up in 1-2 months or sooner if needed ?  ? ?I discussed the assessment and treatment plan with the patient. The patient was provided an opportunity to ask questions and all were answered. The patient agreed with the plan and demonstrated an understanding of the instructions. ?  ?The patient was advised to call back or seek an in-person evaluation if the symptoms worsen or if the condition fails to improve as anticipated. ? ?I provided 23 minutes of non-face-to-face time during this encounter. ? ? ? , MD ? ?

## 2021-08-08 ENCOUNTER — Telehealth (HOSPITAL_COMMUNITY): Payer: Self-pay | Admitting: Psychiatry

## 2021-08-08 MED ORDER — QUETIAPINE FUMARATE 50 MG PO TABS: 50.0000 mg | ORAL_TABLET | Freq: Every day | ORAL | 0 refills | Status: DC

## 2021-08-08 NOTE — Telephone Encounter (Signed)
[name]'s mom called to ask if Ian Lamb can start Seroquel for sleep. I spoke with her on the phone. She states she has talked to other parents who have dealt with same problems they have had good success with it.  ? ?Mom shares that Ian Lamb is not sleeping well. His sleep schedule is off most nights and it makes him frustrated. He is up till 4-6 am and then sleeps into the afternoon. He told her that he has been having nightmares. He has been taking his meds as prescribed. Mom shares that he is not eating well and often eats junk food. Mom states that Ian Lamb was bullied due to his ADHD and as a result always tries to present in the best light with therapist, teachers and doctors. Mom shares that he is very depressed. He is angry all the time and everything is a challenge. The current meds so help some but not enough. He smokes marijuana and drinks alcohol. She is not sure how much but thinks it could up to 10 beers in 1 night. He doesn't drink in front of them but she knows it is excessive. She has noticed some decrease in anxiety with Propranolol but not much. Several family members have had nightmares with Trazodone so she doesn't want him taking it. Mom states that Ian Lamb was interested in outpatient therapy a year ago but was told he didn't need it. He is still interested in doing it. She asked that I bring it up with him to see if he is still interested. She re-iterated several times that he is not always truthful or forthcoming with information and that he is not doing as well as he told me yesterday.  ? ?P: ?D/c Trazodone ?Start Seroquel 50mg  po qHS ?Continue Effexor XR, Propranolol and Adderall ? ?F/up in 2 weeks or sooner if needed ?Total time on call - 20 min ?

## 2021-08-09 ENCOUNTER — Encounter: Payer: Self-pay | Admitting: Nurse Practitioner

## 2021-08-09 ENCOUNTER — Encounter: Payer: Self-pay | Admitting: Family Medicine

## 2021-08-11 ENCOUNTER — Ambulatory Visit (INDEPENDENT_AMBULATORY_CARE_PROVIDER_SITE_OTHER): Payer: Managed Care, Other (non HMO) | Admitting: Nurse Practitioner

## 2021-08-11 ENCOUNTER — Other Ambulatory Visit (HOSPITAL_COMMUNITY): Payer: Self-pay | Admitting: Psychiatry

## 2021-08-11 VITALS — BP 128/76 | HR 89 | Temp 97.3°F | Resp 14 | Ht 71.0 in | Wt 185.4 lb

## 2021-08-11 DIAGNOSIS — F324 Major depressive disorder, single episode, in partial remission: Secondary | ICD-10-CM

## 2021-08-11 DIAGNOSIS — L255 Unspecified contact dermatitis due to plants, except food: Secondary | ICD-10-CM | POA: Diagnosis not present

## 2021-08-11 MED ORDER — METHYLPREDNISOLONE ACETATE 40 MG/ML IJ SUSP
40.0000 mg | Freq: Once | INTRAMUSCULAR | Status: AC
  Administered 2021-08-11: 40 mg via INTRAMUSCULAR

## 2021-08-11 MED ORDER — PREDNISONE 20 MG PO TABS: ORAL_TABLET | ORAL | 0 refills | Status: AC

## 2021-08-11 NOTE — Telephone Encounter (Signed)
Patient sent message to [facility] also, I have replied to the patient to let him know office visit is needed, see other mychart message ?

## 2021-08-11 NOTE — Progress Notes (Signed)
? ?  Acute Office Visit ? ?Subjective:  ? ?  ?Patient ID: Ian Lamb, male    DOB: 08-Jan-2001, 21 y.o.   MRN: [id number] ? ?Chief Complaint  ?Patient presents with  ? Rash  ?  Started about 3 days ago-Upper right leg, right cheek, both arms. Itchy, red.   ? ? ? ?Patient is in today for rash ? ?States that it started approx 3 days ago. States that it stared on his right upper thigh and right face. States that he has not tried any antihistamines. He has used what soulnds like a calamine lotion and something else that he is not sure about. States that he is the only person in the house with the rash. He thinks that the dog brought it in. ? ?Review of Systems  ?Constitutional:  Negative for chills and fever.  ?Respiratory:  Negative for cough and shortness of breath.   ?Cardiovascular:  Negative for chest pain.  ?Gastrointestinal:  Negative for diarrhea, nausea and vomiting.  ?Skin:  Positive for rash.  ? ? ?   ?Objective:  ?  ?BP 128/76   Pulse 89   Temp (!) 97.3 ?F (36.3 ?C)   Resp 14   Ht 5\' 11"  (1.803 m)   Wt 185 lb 6 oz (84.1 kg)   SpO2 98%   BMI 25.85 kg/m?  ? ? ?Physical Exam ?Vitals and nursing note reviewed.  ?Constitutional:   ?   Appearance: Normal appearance.  ?HENT:  ?   Mouth/Throat:  ?   Mouth: Mucous membranes are moist.  ?   Pharynx: Oropharynx is clear. No posterior oropharyngeal erythema.  ?Cardiovascular:  ?   Rate and Rhythm: Normal rate and regular rhythm.  ?   Heart sounds: Normal heart sounds.  ?Pulmonary:  ?   Effort: Pulmonary effort is normal.  ?   Breath sounds: Normal breath sounds.  ?Abdominal:  ?   General: Bowel sounds are normal.  ?Skin: ?   General: Skin is warm.  ?   Findings: Rash present.  ?Neurological:  ?   Mental Status: He is alert.  ? ? ? ? ? ?No results found for any visits on 08/11/21. ? ? ?   ?Assessment & Plan:  ? ?Problem List Items Addressed This Visit   ? ?  ? Musculoskeletal and Integument  ? Rhus dermatitis - Primary  ?  Story and exam consistent with rhus  dermatitis.  We will give Depo-Medrol 40 mg IM in office x1 dose.  We will send a taper for prednisone for patient over the next 6 days.  Did advise patient avoid NSAIDs while taking medication.  Follow-up if no improvement.  Did review signs symptoms seek urgent or emergent health care.  Patient has no oral, airway, and ocular involvement ? ?  ?  ? Relevant Medications  ? predniSONE (DELTASONE) 20 MG tablet  ? ? ?Meds ordered this encounter  ?Medications  ? predniSONE (DELTASONE) 20 MG tablet  ?  Sig: Take 1 tablet (20 mg total) by mouth 2 (two) times daily with a meal for 3 days, THEN 1 tablet (20 mg total) daily with breakfast for 3 days. Start on 08/12/2021.  ?  Dispense:  9 tablet  ?  Refill:  0  ?  Order Specific Question:   Supervising Provider  ?  Answer:   08/14/2021 A [[id number]]  ? methylPREDNISolone acetate (DEPO-MEDROL) injection 40 mg  ? ? ?No follow-ups on file. ? ?Roxy Manns, NP ? ? ?

## 2021-08-11 NOTE — Assessment & Plan Note (Signed)
Story and exam consistent with rhus dermatitis.  We will give Depo-Medrol 40 mg IM in office x1 dose.  We will send a taper for prednisone for patient over the next 6 days.  Did advise patient avoid NSAIDs while taking medication.  Follow-up if no improvement.  Did review signs symptoms seek urgent or emergent health care.  Patient has no oral, airway, and ocular involvement ?

## 2021-08-11 NOTE — Patient Instructions (Signed)
Nice to see you today ?Gave you an injection of steroids today. Start the steroid pills tomorrow. Avoid NSAID medications while taking these pills. This includes ibuprofen, aleve, naproxen, motrin, BC/Goody powders ?Follow up if no improvement ?

## 2021-08-12 ENCOUNTER — Encounter (HOSPITAL_COMMUNITY): Payer: Self-pay | Admitting: Psychiatry

## 2021-08-21 ENCOUNTER — Telehealth (HOSPITAL_COMMUNITY): Payer: Self-pay | Admitting: Psychiatry

## 2021-08-21 ENCOUNTER — Telehealth (HOSPITAL_COMMUNITY): Payer: 59 | Admitting: Psychiatry

## 2021-08-21 ENCOUNTER — Telehealth (HOSPITAL_COMMUNITY): Payer: Self-pay | Admitting: *Deleted

## 2021-08-21 NOTE — Telephone Encounter (Signed)
Pt's mother,[name], called to let you know that pt refused to get up for appointment this morning. Also his phone may not be active currently. Mother says the Seroquel is helping but he is asking about drinking alcohol while on this med. Mrs. Skeet Simmer stated that she would br happy to speak with you today. Pt appointment has been rescheduled to 09/11/21. FYI. ?

## 2021-08-21 NOTE — Telephone Encounter (Signed)
Patient was not present on video platform used through mychart. I called the patient at our scheduled appointment time. There was no answer. I left a voice message for patient to call the clinic back at their convinence.  ? ?

## 2021-08-31 ENCOUNTER — Other Ambulatory Visit (HOSPITAL_COMMUNITY): Payer: Self-pay | Admitting: Psychiatry

## 2021-08-31 DIAGNOSIS — F324 Major depressive disorder, single episode, in partial remission: Secondary | ICD-10-CM

## 2021-09-10 ENCOUNTER — Telehealth (HOSPITAL_COMMUNITY): Payer: Self-pay | Admitting: *Deleted

## 2021-09-10 NOTE — Telephone Encounter (Signed)
FYI Pt's mother, Webb Silversmith, called leaving 2 VM regarding wanting to get pt to go inpt but pt is refusing and since he has an appointment with you tomorrow @ 0830 she will wait and speak with you. She has reminded pt to charge his phone. Mother did not say that pt is suicidal, more r/t his behaviors or lack thereof.

## 2021-09-10 NOTE — Telephone Encounter (Signed)
Writer returned mothers' second message. Mother is very anxious about pt's anger, rage and rumination about his ex-girlfriend, her friends, and her mother. Thurston Hole was asking what she should do in those cases. Mother states that pt currently is eerily quite and this is unnerving. We discussed going to [facility], Calling 911 for the mental health team if needed, or 911 for IVC if warranted. This nurse also gave her some information on local groups, counseling services, including IOP and PHP. Parents are looking for long term treatment facility. FYI for appointment tomorrow.

## 2021-09-11 ENCOUNTER — Telehealth (HOSPITAL_BASED_OUTPATIENT_CLINIC_OR_DEPARTMENT_OTHER): Payer: 59 | Admitting: Psychiatry

## 2021-09-11 DIAGNOSIS — F411 Generalized anxiety disorder: Secondary | ICD-10-CM

## 2021-09-11 DIAGNOSIS — F5105 Insomnia due to other mental disorder: Secondary | ICD-10-CM | POA: Diagnosis not present

## 2021-09-11 DIAGNOSIS — F324 Major depressive disorder, single episode, in partial remission: Secondary | ICD-10-CM

## 2021-09-11 DIAGNOSIS — F99 Mental disorder, not otherwise specified: Secondary | ICD-10-CM

## 2021-09-11 DIAGNOSIS — F900 Attention-deficit hyperactivity disorder, predominantly inattentive type: Secondary | ICD-10-CM | POA: Diagnosis not present

## 2021-09-11 NOTE — Progress Notes (Signed)
Virtual Visit via Phone Note  I connected with Ian Lamb on 09/11/21 at  8:30 AM EDT by phone. He refused to a video enabled telemedicine application today due to concerns about our appointment time being cut short while trying to connect.  I verified that I am speaking with the correct person using two identifiers.  Location: Patient: home Provider: office   I discussed the limitations of evaluation and management by telemedicine and the availability of in person appointments. The patient expressed understanding and agreed to proceed.  History of Present Illness: Ian Lamb refused to do a video visit today. [name]'s mom is present for today's visit. Ian Lamb shares that the Seroquel was making him shake. He stopped taking it around 10 days ago. Mom states he was taking it inconsistently. Ian Lamb states it made him sleep deeply for over 10-12 hrs. He didn't like it. Off Seroquel he is now sleeping somewhere between 9:30-11pm. If stressed he will not fall asleep until 2-3am. When he sleeps he gets about 6-8 hrs of broken sleep. Some days he sleeps until he goes into work at [employer] has picked up the Trazodone but Ian Lamb has not tried it yet. He has tried OTC Melatonin and it was ineffective. He has tried Xanax in the past it didn't help with sleep. [name]'s ex-girlfriend is back from college. He has been having nightmares about some of the things he had problems with his ex. It is causing him to feel depressed and angry. The depression is happening more frequently. When it is really bad he will take anxiety meds. He is trying to cut back on marijuana use but did use last night. Ian Lamb has not been socializing as much as before. Others can tell he is down and his stress tolerance has decreased. Ian Lamb is having more frequent and intense thoughts of death. He often wishes he was never born. He denies SI/HI. [name]'s anxiety is much worse. Anxiety goes hand in hand with his depression. Propranolol does help to calm her  sometimes. Most of the time it is not effective. Ian Lamb and his mom shares that he takes the Adderall before work and is not abusing it. He takes it around 4pm and it lasts until 9:30pm and does take the 2nd dose.   Mom shared that Ian Lamb is interested in groups. They are considering the [facility] or [place]. Ian Lamb has been going to his therapy appointments but Ian Lamb does not feel it is helpful and wants to try something else. Ian Lamb became upset and punched a hole in the wall and left to go to the bathroom. He cried because he was feeling depressed and frustrated that nothing seems to be helping despite his best efforts. Yesterday was really bad- he is very depressed and angry. Mom thinks IOP or PHP or even inpatient psych hospitalization is going to help.    Observations/Objective: Psychiatric Specialty Exam:  General Appearance: unable to assess  Eye Contact:  unable to assess  Speech:  Clear and Coherent and Slow  Volume:  Normal  Mood:  Anxious and Depressed  Affect:  Congruent and Tearful  Thought Process:  Goal Directed, Linear, and Descriptions of Associations: Intact  Orientation:  Full (Time, Place, and Person)  Thought Content:  Logical  Suicidal Thoughts:  No  Homicidal Thoughts:  No  Memory:  Immediate;   Good  Judgement:  Fair  Insight:  Good  Psychomotor Activity: unable to assess  Concentration:  Concentration: Good  Recall:  Good  Fund of Knowledge:  Good  Language:  Good  Akathisia:  unable to assess  Handed:  unable to assess  AIMS (if indicated):     Assets:  Communication Skills Desire for Improvement Financial Resources/Insurance Housing Leisure Time [place] Talents/Skills Transportation Vocational/Educational  ADL's:  unable to assess  Cognition:  WNL  Sleep:         Assessment and Plan:     09/11/2021    8:49 AM 08/07/2021    1:44 PM 05/01/2021    2:46 PM 05/01/2021    2:42 PM 02/06/2021    2:38 PM  Depression  screen PHQ 2/9  Decreased Interest 2 1 0 0 0  Down, Depressed, Hopeless 2 1 3 1 1   PHQ - 2 Score 4 2 3 1 1   Altered sleeping 2 1 0    Tired, decreased energy 2 1 0    Change in appetite  0 0    Feeling bad or failure about yourself   2 3    Trouble concentrating  3 1    Moving slowly or fidgety/restless  0 0    Suicidal thoughts  0     PHQ-9 Score 8 9 7     Difficult doing work/chores  Very difficult Somewhat difficult      Flowsheet Row Video Visit from 08/07/2021 in [facility] Video Visit from 05/01/2021 in [facility] Video Visit from 11/28/2020 in [facility]  C-SSRS RISK CATEGORY No Risk No Risk No Risk        Status of current problems: worsening depression and anxiety and insomnia  Meds:  D/c Seroquel   Discuss the risk of death with use of drugs or alcohol when taking sedative medications. Ian Lamb verbalized understanding.  Continue Effexor XR 225mg  po qD  Start trial with Trazodone prn for sleep  Continue Adderall for ADHD  1. Major depressive disorder with single episode, in partial remission ([place])  2. GAD (generalized anxiety disorder)  3. Insomnia due to other mental disorder  4. Attention deficit hyperactivity disorder (ADHD), predominantly inattentive type       Therapy: brief supportive therapy provided. Discussed psychosocial stressors in detail.   Collaboration of Care: Referral or follow-up with counselor/therapist AEB referral to IOP and PHP programs  Patient/Guardian was advised Release of Information must be obtained prior to any record release in order to collaborate their care with an outside provider. Patient/Guardian was advised if they have not already done so to contact the registration department to sign all necessary forms in order for [place] to release information regarding their care.   Consent: Patient/Guardian gives  verbal consent for treatment and assignment of benefits for services provided during this visit. Patient/Guardian expressed understanding and agreed to proceed.      Follow Up Instructions: Follow up in 1-2 months or sooner if needed    I discussed the assessment and treatment plan with the patient. The patient was provided an opportunity to ask questions and all were answered. The patient agreed with the plan and demonstrated an understanding of the instructions.   The patient was advised to call back or seek an in-person evaluation if the symptoms worsen or if the condition fails to improve as anticipated.  I provided 30 minutes of non-face-to-face time during this encounter.   [name], MD

## 2021-09-30 ENCOUNTER — Encounter: Payer: Self-pay | Admitting: Nurse Practitioner

## 2021-10-02 ENCOUNTER — Telehealth (HOSPITAL_COMMUNITY): Payer: Self-pay | Admitting: Psychiatry

## 2021-10-02 NOTE — Telephone Encounter (Signed)
D:  Patient's mother Sandi Carne) had called to inquire about more resources for her son.  A:  Placed call to the mother, but there was no answer.  Left vm with the case manager's name and phone #.  Inform Delanna Ahmadi, LPN.

## 2021-10-03 ENCOUNTER — Telehealth (HOSPITAL_COMMUNITY): Payer: Self-pay | Admitting: Psychiatry

## 2021-10-03 NOTE — Telephone Encounter (Signed)
D:  Returned call to patient's mother Sandi Carne) but there was no answer.  A:  Left vm reiterating resources [facility] and [place]).  Informed her that [facility] has the young adult program that may be the best fit for her son.  Encouraged her to call [facility] and they would direct her as to how to get son into their program.  She had a question about his appt with Dr. Michae Kava; informed her to contact the front desk before 12 noon today.

## 2021-10-27 ENCOUNTER — Telehealth (HOSPITAL_COMMUNITY): Payer: Self-pay

## 2021-10-27 NOTE — Telephone Encounter (Signed)
Medication management - Patient's Mother left a voice message stating she was aware patient was seeing Dr. Michae Kava this week on 10/30/21 and she wanted to let Dr. Michae Kava know pt's therapist was suggesting a nonstimulant such as Strattera or Qelbree to help instead of Adderall 10 mg.  [place] stated there was no need to call back but just wanted to share this suggestion for Dr. Michae Kava to consider at appointment 10/30/21.

## 2021-10-30 ENCOUNTER — Telehealth (HOSPITAL_BASED_OUTPATIENT_CLINIC_OR_DEPARTMENT_OTHER): Payer: 59 | Admitting: Psychiatry

## 2021-10-30 DIAGNOSIS — F5105 Insomnia due to other mental disorder: Secondary | ICD-10-CM

## 2021-10-30 DIAGNOSIS — F99 Mental disorder, not otherwise specified: Secondary | ICD-10-CM

## 2021-10-30 DIAGNOSIS — F900 Attention-deficit hyperactivity disorder, predominantly inattentive type: Secondary | ICD-10-CM | POA: Diagnosis not present

## 2021-10-30 DIAGNOSIS — F324 Major depressive disorder, single episode, in partial remission: Secondary | ICD-10-CM

## 2021-10-30 DIAGNOSIS — F411 Generalized anxiety disorder: Secondary | ICD-10-CM | POA: Diagnosis not present

## 2021-10-30 MED ORDER — VENLAFAXINE HCL ER 75 MG PO CP24: 225.0000 mg | ORAL_CAPSULE | Freq: Every day | ORAL | 0 refills | Status: DC

## 2021-10-30 MED ORDER — DOXYLAMINE SUCCINATE (SLEEP) 25 MG PO TABS: 25.0000 mg | ORAL_TABLET | Freq: Every evening | ORAL | 0 refills | Status: DC | PRN

## 2021-10-30 MED ORDER — PROPRANOLOL HCL 20 MG PO TABS
20.0000 mg | ORAL_TABLET | Freq: Two times a day (BID) | ORAL | 0 refills | Status: DC | PRN
Start: 2021-10-30 — End: 2022-02-12

## 2021-10-30 NOTE — Progress Notes (Signed)
Virtual Visit via Video Note  I connected with Ian Lamb IV on 10/30/21 at  1:45 PM EDT by a video enabled telemedicine application and verified that I am speaking with the correct person using two identifiers.  Location: Patient: home Provider: office   I discussed the limitations of evaluation and management by telemedicine and the availability of in person appointments. The patient expressed understanding and agreed to proceed.  History of Present Illness: Ian Lamb shares that the summer has been ok. He has been taking his meds more consistently. All meds are helping excepting for his sleeping pill. It can be a normal day without any alcohol or marijuana but feels stressed and he will shaking all over. The last time was about 1 month ago. The last time he took Seroquel was 6 weeks. His sleep is variable from day to day. He will get 6-8 hrs some nights and other nights he will not sleep until the morning. He does not think he took Trazodone at all. His mom states  He takes Adderall when he needs to be productive at work, therapy or social situations. Ian Lamb takes it about 1x/week. He has not taken it in 1-2 months. He has been traveling a lot and wasn't working. Today he back to work so he plans to restart it. He is worried about becoming addicted to it. He has seen how others react when they become addicted.   Ian Lamb shares that is anxiety is "fine". He has random nightmares about his ex and her mom about 4x/week. He feels better at the cabin in [place] when he is alone. He is wanting to move into his own place and hopes to do so sometime in the fall. He thinks it will be good for him. He takes Propranolol once/day and sometimes 2-3x if he is really stressed.   Ian Lamb is depressed right after getting up and getting to bed. Ian Lamb states he enjoys himself when outside of [place]. He has fun with his friends. Ian Lamb is eating well. He is tired a lot due to his sleep schedule. Ian Lamb has passive thoughts of  death about 2x/month.  The last time was 2.5 weeks ago. He denies SI/HI.    He is in therapy 1-2x/month. They are working on maintaining a schedule and daily med compliance. He really likes his therapist.   Observations/Objective: Psychiatric Specialty Exam: ROS  There were no vitals taken for this visit.There is no height or weight on file to calculate BMI.  General Appearance: Casual and Fairly Groomed  Eye Contact:  Fair  Speech:  Clear and Coherent and Normal Rate  Volume:  Normal  Mood:  Anxious and Depressed  Affect:  Congruent  Thought Process:  Goal Directed, Linear, and Descriptions of Associations: Intact  Orientation:  Full (Time, Place, and Person)  Thought Content:  Logical  Suicidal Thoughts:  No  Homicidal Thoughts:  No  Memory:  Immediate;   Good  Judgement:  Fair  Insight:  Fair  Psychomotor Activity:  Normal  Concentration:  Concentration: Good  Recall:  Good  Fund of Knowledge:  Good  Language:  Good  Akathisia:  No  Handed:  Right  AIMS (if indicated):     Assets:  Communication Skills Desire for Improvement Financial Resources/Insurance Housing Leisure Time Physical Health Social Support Talents/Skills Transportation Vocational/Educational  ADL's:  Intact  Cognition:  WNL  Sleep:        Assessment and Plan:     10/30/2021    2:07 PM  09/11/2021    8:49 AM 08/07/2021    1:44 PM 05/01/2021    2:46 PM 05/01/2021    2:42 PM  Depression screen PHQ 2/9  Decreased Interest 1 2 1  0 0  Down, Depressed, Hopeless 3 2 1 3 1   PHQ - 2 Score 4 4 2 3 1   Altered sleeping 3 2 1  0   Tired, decreased energy 1 2 1  0   Change in appetite 0  0 0   Feeling bad or failure about yourself  3  2 3    Trouble concentrating 3  3 1    Moving slowly or fidgety/restless 0  0 0   Suicidal thoughts 1  0    PHQ-9 Score 15 8 9 7    Difficult doing work/chores Very difficult  Very difficult Somewhat difficult     Flowsheet Row [facility] Video Visit from [facility]07[facility]2021 [facility] Video Visit from [facility]01/2021 [facility]  C-SSRS RISK CATEGORY Error: Q3, 4, or 5 should not be populated when Q2 is No No Risk No Risk        Status of current problems: ongoing depression and anxiety and insomnia  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   Meds:  Start Doxylamine 25-50mg  po qHS prn insomnia  1. GAD (generalized anxiety disorder) - propranolol (INDERAL) 20 MG tablet; Take 1 tablet (20 mg total) by mouth 2 (two) times daily as needed (anxiety).  Dispense: 180 tablet; Refill: 0 - venlafaxine XR (EFFEXOR XR) 75 MG 24 hr capsule; Take 3 capsules (225 mg total) by mouth daily.  Dispense: 270 capsule; Refill: 0  2. Major depressive disorder with single episode, in partial remission ([facility]) - venlafaxine XR (EFFEXOR XR) 75 MG 24 hr capsule; Take 3 capsules (225 mg total) by mouth daily.  Dispense: 270 capsule; Refill: 0  3. Insomnia due to other mental disorder - doxylamine, Sleep, (UNISOM) 25 MG tablet; Take 1 tablet (25 mg total) by mouth at bedtime as needed.  Dispense: 30 tablet; Refill: 0  4. Attention deficit hyperactivity disorder (ADHD), predominantly inattentive type  -continue Adderall    Labs: none    Therapy: brief supportive therapy provided. Discussed psychosocial stressors in detail.   Collaboration of Care: Referral or follow-up with counselor/therapist AEB Dr. in [place].   Patient/Guardian was advised Release of Information must be obtained prior to any record release in order to collaborate their care with an outside provider. Patient/Guardian was advised if they have not already done so to contact the registration department to sign all  necessary forms in order for to release information regarding their care.   Consent: Patient/Guardian gives verbal consent for treatment and assignment of benefits for services provided during this visit. Patient/Guardian expressed understanding and agreed to proceed.   Follow Up Instructions: Follow up in 1-2 months or sooner if needed    I discussed the assessment and treatment plan with the patient. The patient was provided an opportunity to ask questions and all were answered. The patient agreed with the plan and demonstrated an understanding of the instructions.   The patient was advised to call back or seek an in-person evaluation if the symptoms worsen or if the condition fails to improve as anticipated.  I provided 25 minutes of non-face-to-face time during this encounter.  [name], MD

## 2021-12-15 ENCOUNTER — Emergency Department (HOSPITAL_COMMUNITY): Payer: Managed Care, Other (non HMO)

## 2021-12-15 ENCOUNTER — Encounter (HOSPITAL_COMMUNITY)
Admission: EM | Disposition: A | Discharge status: Home / Self Care | Payer: Self-pay | Source: Home / Self Care | Attending: Student

## 2021-12-15 ENCOUNTER — Other Ambulatory Visit: Payer: Self-pay

## 2021-12-15 ENCOUNTER — Observation Stay (HOSPITAL_COMMUNITY): Payer: Managed Care, Other (non HMO) | Admitting: Anesthesiology

## 2021-12-15 ENCOUNTER — Encounter (HOSPITAL_COMMUNITY): Payer: Self-pay

## 2021-12-15 ENCOUNTER — Observation Stay (HOSPITAL_BASED_OUTPATIENT_CLINIC_OR_DEPARTMENT_OTHER): Payer: Managed Care, Other (non HMO) | Admitting: Anesthesiology

## 2021-12-15 ENCOUNTER — Observation Stay (HOSPITAL_COMMUNITY)
Admission: EM | Admit: 2021-12-15 | Discharge: 2021-12-16 | Disposition: A | Discharge status: Home / Self Care | Payer: Managed Care, Other (non HMO) | Attending: Internal Medicine | Admitting: Internal Medicine

## 2021-12-15 DIAGNOSIS — F32A Depression, unspecified: Secondary | ICD-10-CM | POA: Diagnosis not present

## 2021-12-15 DIAGNOSIS — Z23 Encounter for immunization: Secondary | ICD-10-CM | POA: Diagnosis not present

## 2021-12-15 DIAGNOSIS — Z20822 Contact with and (suspected) exposure to covid-19: Secondary | ICD-10-CM | POA: Insufficient documentation

## 2021-12-15 DIAGNOSIS — F324 Major depressive disorder, single episode, in partial remission: Secondary | ICD-10-CM | POA: Diagnosis present

## 2021-12-15 DIAGNOSIS — S61411A Laceration without foreign body of right hand, initial encounter: Secondary | ICD-10-CM | POA: Diagnosis not present

## 2021-12-15 DIAGNOSIS — F17210 Nicotine dependence, cigarettes, uncomplicated: Secondary | ICD-10-CM | POA: Insufficient documentation

## 2021-12-15 DIAGNOSIS — S56321A Laceration of extensor or abductor muscles, fascia and tendons of right thumb at forearm level, initial encounter: Secondary | ICD-10-CM | POA: Diagnosis not present

## 2021-12-15 DIAGNOSIS — F10129 Alcohol abuse with intoxication, unspecified: Secondary | ICD-10-CM | POA: Diagnosis not present

## 2021-12-15 DIAGNOSIS — R45851 Suicidal ideations: Secondary | ICD-10-CM | POA: Diagnosis not present

## 2021-12-15 DIAGNOSIS — F419 Anxiety disorder, unspecified: Secondary | ICD-10-CM | POA: Diagnosis not present

## 2021-12-15 DIAGNOSIS — S61216A Laceration without foreign body of right little finger without damage to nail, initial encounter: Secondary | ICD-10-CM | POA: Diagnosis not present

## 2021-12-15 DIAGNOSIS — Z79899 Other long term (current) drug therapy: Secondary | ICD-10-CM | POA: Insufficient documentation

## 2021-12-15 DIAGNOSIS — Y92009 Unspecified place in unspecified non-institutional (private) residence as the place of occurrence of the external cause: Secondary | ICD-10-CM | POA: Insufficient documentation

## 2021-12-15 DIAGNOSIS — F10929 Alcohol use, unspecified with intoxication, unspecified: Secondary | ICD-10-CM | POA: Diagnosis not present

## 2021-12-15 DIAGNOSIS — S61511A Laceration without foreign body of right wrist, initial encounter: Secondary | ICD-10-CM | POA: Diagnosis present

## 2021-12-15 DIAGNOSIS — Z046 Encounter for general psychiatric examination, requested by authority: Secondary | ICD-10-CM

## 2021-12-15 DIAGNOSIS — W25XXXA Contact with sharp glass, initial encounter: Secondary | ICD-10-CM | POA: Insufficient documentation

## 2021-12-15 DIAGNOSIS — S51819A Laceration without foreign body of unspecified forearm, initial encounter: Secondary | ICD-10-CM | POA: Diagnosis present

## 2021-12-15 DIAGNOSIS — S51811A Laceration without foreign body of right forearm, initial encounter: Secondary | ICD-10-CM | POA: Insufficient documentation

## 2021-12-15 HISTORY — PX: NERVE AND TENDON REPAIR: SHX5693

## 2021-12-15 HISTORY — DX: Family history of other specified conditions: Z84.89

## 2021-12-15 LAB — RESP PANEL BY RT-PCR (FLU A&B, COVID) ARPGX2
Influenza A by PCR: NEGATIVE
Influenza B by PCR: NEGATIVE
SARS Coronavirus 2 by RT PCR: NEGATIVE

## 2021-12-15 LAB — RAPID URINE DRUG SCREEN, HOSP PERFORMED
Amphetamines: NOT DETECTED
Barbiturates: NOT DETECTED
Benzodiazepines: NOT DETECTED
Cocaine: NOT DETECTED
Opiates: NOT DETECTED
Tetrahydrocannabinol: POSITIVE — AB

## 2021-12-15 LAB — BASIC METABOLIC PANEL
Anion gap: 8 (ref 5–15)
BUN: 11 mg/dL (ref 6–20)
CO2: 20 mmol/L — ABNORMAL LOW (ref 22–32)
Calcium: 8.9 mg/dL (ref 8.9–10.3)
Chloride: 110 mmol/L (ref 98–111)
Creatinine, Ser: 1.09 mg/dL (ref 0.61–1.24)
GFR, Estimated: >60 mL/min (ref >60)
Glucose, Bld: 114 mg/dL — ABNORMAL HIGH (ref 70–99)
Potassium: 3.5 mmol/L (ref 3.5–5.1)
Sodium: 138 mmol/L (ref 135–145)

## 2021-12-15 LAB — CBC WITH DIFFERENTIAL/PLATELET
Abs Immature Granulocytes: 0.03 10*3/uL (ref 0.00–0.07)
Basophils Absolute: 0.1 10*3/uL (ref 0.0–0.1)
Basophils Relative: 1 %
Eosinophils Absolute: 0.1 10*3/uL (ref 0.0–0.5)
Eosinophils Relative: 2 %
HCT: 42.4 % (ref 39.0–52.0)
Hemoglobin: 15.1 g/dL (ref 13.0–17.0)
Immature Granulocytes: 0 %
Lymphocytes Relative: 34 %
Lymphs Abs: 2.9 10*3/uL (ref 0.7–4.0)
MCH: 31.5 pg (ref 26.0–34.0)
MCHC: 35.6 g/dL (ref 30.0–36.0)
MCV: 88.5 fL (ref 80.0–100.0)
Monocytes Absolute: 0.7 10*3/uL (ref 0.1–1.0)
Monocytes Relative: 8 %
Neutro Abs: 4.8 10*3/uL (ref 1.7–7.7)
Neutrophils Relative %: 55 %
Platelets: 335 10*3/uL (ref 150–400)
RBC: 4.79 MIL/uL (ref 4.22–5.81)
RDW: 12.1 % (ref 11.5–15.5)
WBC: 8.7 10*3/uL (ref 4.0–10.5)
nRBC: 0 % (ref 0.0–0.2)

## 2021-12-15 LAB — HEPATIC FUNCTION PANEL
ALT: 26 U/L (ref 0–44)
AST: 29 U/L (ref 15–41)
Albumin: 4.3 g/dL (ref 3.5–5.0)
Alkaline Phosphatase: 56 U/L (ref 38–126)
Bilirubin, Direct: <0.1 mg/dL (ref 0.0–0.2)
Total Bilirubin: 0.3 mg/dL (ref 0.3–1.2)
Total Protein: 7.2 g/dL (ref 6.5–8.1)

## 2021-12-15 LAB — ACETAMINOPHEN LEVEL: Acetaminophen (Tylenol), Serum: <10 ug/mL — ABNORMAL LOW (ref 10–30)

## 2021-12-15 LAB — ETHANOL: Alcohol, Ethyl (B): 209 mg/dL — ABNORMAL HIGH (ref <10)

## 2021-12-15 LAB — HIV ANTIBODY (ROUTINE TESTING W REFLEX): HIV Screen 4th Generation wRfx: NONREACTIVE

## 2021-12-15 LAB — SALICYLATE LEVEL: Salicylate Lvl: <7 mg/dL — ABNORMAL LOW (ref 7.0–30.0)

## 2021-12-15 SURGERY — NERVE AND TENDON REPAIR
Anesthesia: General | Laterality: Right

## 2021-12-15 MED ORDER — DEXAMETHASONE SODIUM PHOSPHATE 10 MG/ML IJ SOLN
INTRAMUSCULAR | Status: AC
  Filled 2021-12-15: qty 1

## 2021-12-15 MED ORDER — HYDROCODONE-ACETAMINOPHEN 5-325 MG PO TABS
1.0000 | ORAL_TABLET | Freq: Once | ORAL | Status: AC
  Administered 2021-12-15: 1 via ORAL
  Filled 2021-12-15: qty 1

## 2021-12-15 MED ORDER — ORAL CARE MOUTH RINSE: 15.0000 mL | Freq: Once | OROMUCOSAL | Status: AC

## 2021-12-15 MED ORDER — BUPIVACAINE-EPINEPHRINE 0.5% -1:200000 IJ SOLN
INTRAMUSCULAR | Status: DC | PRN
  Administered 2021-12-15: 20 mL

## 2021-12-15 MED ORDER — LACTATED RINGERS IV SOLN: INTRAVENOUS | Status: DC

## 2021-12-15 MED ORDER — ACETAMINOPHEN 10 MG/ML IV SOLN
INTRAVENOUS | Status: AC
  Filled 2021-12-15: qty 100

## 2021-12-15 MED ORDER — PHENYLEPHRINE 80 MCG/ML (10ML) SYRINGE FOR IV PUSH (FOR BLOOD PRESSURE SUPPORT)
PREFILLED_SYRINGE | INTRAVENOUS | Status: AC
  Filled 2021-12-15: qty 10

## 2021-12-15 MED ORDER — DEXMEDETOMIDINE (PRECEDEX) IN NS 20 MCG/5ML (4 MCG/ML) IV SYRINGE
PREFILLED_SYRINGE | INTRAVENOUS | Status: DC | PRN
  Administered 2021-12-15 (×2): 8 ug via INTRAVENOUS
  Administered 2021-12-15: 4 ug via INTRAVENOUS
  Administered 2021-12-15: 8 ug via INTRAVENOUS

## 2021-12-15 MED ORDER — DEXAMETHASONE SODIUM PHOSPHATE 10 MG/ML IJ SOLN
INTRAMUSCULAR | Status: DC | PRN
  Administered 2021-12-15: 5 mg via INTRAVENOUS

## 2021-12-15 MED ORDER — PHENYLEPHRINE 80 MCG/ML (10ML) SYRINGE FOR IV PUSH (FOR BLOOD PRESSURE SUPPORT)
PREFILLED_SYRINGE | INTRAVENOUS | Status: DC | PRN
  Administered 2021-12-15 (×2): 80 ug via INTRAVENOUS
  Administered 2021-12-15: 160 ug via INTRAVENOUS
  Administered 2021-12-15 (×2): 80 ug via INTRAVENOUS
  Administered 2021-12-15: 160 ug via INTRAVENOUS

## 2021-12-15 MED ORDER — MIDAZOLAM HCL 2 MG/2ML IJ SOLN
INTRAMUSCULAR | Status: AC
Start: 2021-12-15 — End: ?
  Filled 2021-12-15: qty 2

## 2021-12-15 MED ORDER — BUPIVACAINE HCL (PF) 0.5 % IJ SOLN
INTRAMUSCULAR | Status: AC
Start: 2021-12-15 — End: ?
  Filled 2021-12-15: qty 30

## 2021-12-15 MED ORDER — LIDOCAINE 2% (20 MG/ML) 5 ML SYRINGE
INTRAMUSCULAR | Status: AC
  Filled 2021-12-15: qty 5

## 2021-12-15 MED ORDER — ALBUTEROL SULFATE (2.5 MG/3ML) 0.083% IN NEBU: 2.5000 mg | INHALATION_SOLUTION | Freq: Four times a day (QID) | RESPIRATORY_TRACT | Status: DC | PRN

## 2021-12-15 MED ORDER — BUPIVACAINE-EPINEPHRINE (PF) 0.5% -1:200000 IJ SOLN
INTRAMUSCULAR | Status: AC
Start: 2021-12-15 — End: ?
  Filled 2021-12-15: qty 30

## 2021-12-15 MED ORDER — TETANUS-DIPHTH-ACELL PERTUSSIS 5-2.5-18.5 LF-MCG/0.5 IM SUSY
0.5000 mL | PREFILLED_SYRINGE | Freq: Once | INTRAMUSCULAR | Status: AC
  Administered 2021-12-15: 0.5 mL via INTRAMUSCULAR
  Filled 2021-12-15: qty 0.5

## 2021-12-15 MED ORDER — LORAZEPAM 2 MG/ML IJ SOLN: 0.0000 mg | Freq: Two times a day (BID) | INTRAMUSCULAR | Status: DC

## 2021-12-15 MED ORDER — PROPOFOL 10 MG/ML IV BOLUS
INTRAVENOUS | Status: DC | PRN
  Administered 2021-12-15: 160 mg via INTRAVENOUS

## 2021-12-15 MED ORDER — THIAMINE HCL 100 MG/ML IJ SOLN
100.0000 mg | Freq: Every day | INTRAMUSCULAR | Status: DC
  Filled 2021-12-15: qty 2

## 2021-12-15 MED ORDER — ENOXAPARIN SODIUM 40 MG/0.4ML IJ SOSY
40.0000 mg | PREFILLED_SYRINGE | INTRAMUSCULAR | Status: DC
Start: 2021-12-15 — End: 2021-12-16
  Administered 2021-12-15 – 2021-12-16 (×2): 40 mg via SUBCUTANEOUS
  Filled 2021-12-15 (×2): qty 0.4

## 2021-12-15 MED ORDER — FENTANYL CITRATE (PF) 250 MCG/5ML IJ SOLN
INTRAMUSCULAR | Status: AC
  Filled 2021-12-15: qty 5

## 2021-12-15 MED ORDER — CEFAZOLIN SODIUM-DEXTROSE 2-3 GM-%(50ML) IV SOLR
INTRAVENOUS | Status: DC | PRN
  Administered 2021-12-15: 2 g via INTRAVENOUS

## 2021-12-15 MED ORDER — ACETAMINOPHEN 325 MG PO TABS: 650.0000 mg | ORAL_TABLET | Freq: Four times a day (QID) | ORAL | Status: DC | PRN

## 2021-12-15 MED ORDER — ZIPRASIDONE MESYLATE 20 MG IM SOLR: 20.0000 mg | Freq: Once | INTRAMUSCULAR | Status: DC

## 2021-12-15 MED ORDER — VENLAFAXINE HCL ER 75 MG PO CP24
225.0000 mg | ORAL_CAPSULE | Freq: Every day | ORAL | Status: DC
  Administered 2021-12-15 – 2021-12-16 (×2): 225 mg via ORAL
  Filled 2021-12-15 (×2): qty 1

## 2021-12-15 MED ORDER — ONDANSETRON HCL 4 MG/2ML IJ SOLN
INTRAMUSCULAR | Status: AC
Start: 2021-12-15 — End: ?
  Filled 2021-12-15: qty 2

## 2021-12-15 MED ORDER — LORAZEPAM 1 MG PO TABS: 1.0000 mg | ORAL_TABLET | ORAL | Status: DC | PRN

## 2021-12-15 MED ORDER — ONDANSETRON HCL 4 MG PO TABS: 4.0000 mg | ORAL_TABLET | Freq: Four times a day (QID) | ORAL | Status: DC | PRN

## 2021-12-15 MED ORDER — ZIPRASIDONE MESYLATE 20 MG IM SOLR: 20.0000 mg | Freq: Once | INTRAMUSCULAR | Status: DC | PRN

## 2021-12-15 MED ORDER — FOLIC ACID 1 MG PO TABS
1.0000 mg | ORAL_TABLET | Freq: Every day | ORAL | Status: DC
  Administered 2021-12-15 – 2021-12-16 (×2): 1 mg via ORAL
  Filled 2021-12-15 (×2): qty 1

## 2021-12-15 MED ORDER — ACETAMINOPHEN 10 MG/ML IV SOLN
INTRAVENOUS | Status: DC | PRN
  Administered 2021-12-15: 1000 mg via INTRAVENOUS

## 2021-12-15 MED ORDER — SODIUM CHLORIDE 0.9% FLUSH
3.0000 mL | Freq: Two times a day (BID) | INTRAVENOUS | Status: DC
  Administered 2021-12-15 (×2): 3 mL via INTRAVENOUS

## 2021-12-15 MED ORDER — ADULT MULTIVITAMIN W/MINERALS CH
1.0000 | ORAL_TABLET | Freq: Every day | ORAL | Status: DC
  Administered 2021-12-15 – 2021-12-16 (×2): 1 via ORAL
  Filled 2021-12-15 (×2): qty 1

## 2021-12-15 MED ORDER — ONDANSETRON HCL 4 MG/2ML IJ SOLN
INTRAMUSCULAR | Status: DC | PRN
  Administered 2021-12-15: 4 mg via INTRAVENOUS

## 2021-12-15 MED ORDER — LIDOCAINE 2% (20 MG/ML) 5 ML SYRINGE
INTRAMUSCULAR | Status: DC | PRN
  Administered 2021-12-15: 100 mg via INTRAVENOUS

## 2021-12-15 MED ORDER — CHLORHEXIDINE GLUCONATE 0.12 % MT SOLN
15.0000 mL | Freq: Once | OROMUCOSAL | Status: AC
  Administered 2021-12-15: 15 mL via OROMUCOSAL

## 2021-12-15 MED ORDER — FENTANYL CITRATE (PF) 250 MCG/5ML IJ SOLN
INTRAMUSCULAR | Status: DC | PRN
  Administered 2021-12-15: 150 ug via INTRAVENOUS
  Administered 2021-12-15 (×2): 50 ug via INTRAVENOUS

## 2021-12-15 MED ORDER — THIAMINE MONONITRATE 100 MG PO TABS
100.0000 mg | ORAL_TABLET | Freq: Every day | ORAL | Status: DC
  Administered 2021-12-15 – 2021-12-16 (×2): 100 mg via ORAL
  Filled 2021-12-15 (×3): qty 1

## 2021-12-15 MED ORDER — LORAZEPAM 2 MG/ML IJ SOLN: 1.0000 mg | INTRAMUSCULAR | Status: DC | PRN

## 2021-12-15 MED ORDER — ACETAMINOPHEN 650 MG RE SUPP: 650.0000 mg | Freq: Four times a day (QID) | RECTAL | Status: DC | PRN

## 2021-12-15 MED ORDER — MIDAZOLAM HCL 2 MG/2ML IJ SOLN
INTRAMUSCULAR | Status: DC | PRN
  Administered 2021-12-15: 2 mg via INTRAVENOUS

## 2021-12-15 MED ORDER — LIDOCAINE-EPINEPHRINE (PF) 2 %-1:200000 IJ SOLN
10.0000 mL | Freq: Once | INTRAMUSCULAR | Status: DC
  Filled 2021-12-15: qty 20

## 2021-12-15 MED ORDER — PROPRANOLOL HCL 20 MG PO TABS: 20.0000 mg | ORAL_TABLET | Freq: Every day | ORAL | Status: DC | PRN

## 2021-12-15 MED ORDER — SODIUM CHLORIDE 0.9 % IV SOLN: INTRAVENOUS | Status: DC

## 2021-12-15 MED ORDER — HYDROCODONE-ACETAMINOPHEN 5-325 MG PO TABS
1.0000 | ORAL_TABLET | ORAL | Status: DC | PRN
  Administered 2021-12-15 – 2021-12-16 (×4): 2 via ORAL
  Filled 2021-12-15 (×4): qty 2

## 2021-12-15 MED ORDER — LIDOCAINE HCL (PF) 1 % IJ SOLN
30.0000 mL | Freq: Once | INTRAMUSCULAR | Status: DC
  Filled 2021-12-15: qty 30

## 2021-12-15 MED ORDER — CEFAZOLIN SODIUM-DEXTROSE 1-4 GM/50ML-% IV SOLN
1.0000 g | Freq: Three times a day (TID) | INTRAVENOUS | Status: AC
  Administered 2021-12-15 – 2021-12-16 (×2): 1 g via INTRAVENOUS
  Filled 2021-12-15 (×2): qty 50

## 2021-12-15 MED ORDER — LORAZEPAM 2 MG/ML IJ SOLN: 0.0000 mg | Freq: Four times a day (QID) | INTRAMUSCULAR | Status: DC

## 2021-12-15 MED ORDER — ONDANSETRON HCL 4 MG/2ML IJ SOLN: 4.0000 mg | Freq: Four times a day (QID) | INTRAMUSCULAR | Status: DC | PRN

## 2021-12-15 SURGICAL SUPPLY — 38 items
BAG COUNTER SPONGE SURGICOUNT (BAG) ×1 IMPLANT
BAND RUBBER #18 3X1/16 STRL (MISCELLANEOUS) ×1 IMPLANT
BLADE SURG 15 STRL LF DISP TIS (BLADE) ×1 IMPLANT
BLADE SURG 15 STRL SS (BLADE) ×1
BNDG COHESIVE 4X5 TAN STRL (GAUZE/BANDAGES/DRESSINGS) ×1 IMPLANT
BNDG GAUZE ELAST 4 BULKY (GAUZE/BANDAGES/DRESSINGS) ×2 IMPLANT
CHLORAPREP W/TINT 26 (MISCELLANEOUS) ×1 IMPLANT
CORD BIPOLAR FORCEPS 12FT (ELECTRODE) ×1 IMPLANT
COVER BACK TABLE 60X90IN (DRAPES) ×1 IMPLANT
COVER MAYO STAND STRL (DRAPES) ×1 IMPLANT
DRAPE HALF SHEET 40X57 (DRAPES) ×1 IMPLANT
DRAPE SURG 17X23 STRL (DRAPES) ×1 IMPLANT
DRSG ADAPTIC 3X8 NADH LF (GAUZE/BANDAGES/DRESSINGS) IMPLANT
DRSG EMULSION OIL 3X3 NADH (GAUZE/BANDAGES/DRESSINGS) ×1 IMPLANT
ELECT REM PT RETURN 9FT ADLT (ELECTROSURGICAL) ×1
ELECTRODE REM PT RTRN 9FT ADLT (ELECTROSURGICAL) IMPLANT
GAUZE SPONGE 4X4 12PLY STRL (GAUZE/BANDAGES/DRESSINGS) ×1 IMPLANT
GLOVE BIO SURGEON STRL SZ7.5 (GLOVE) ×1 IMPLANT
GLOVE BIOGEL PI IND STRL 8 (GLOVE) ×1 IMPLANT
GLOVE BIOGEL PI INDICATOR 8 (GLOVE) ×1
KIT BASIN OR (CUSTOM PROCEDURE TRAY) ×1 IMPLANT
NS IRRIG 1000ML POUR BTL (IV SOLUTION) ×1 IMPLANT
PAD CAST 4YDX4 CTTN HI CHSV (CAST SUPPLIES) IMPLANT
PADDING CAST ABS COTTON 4X4 ST (CAST SUPPLIES) ×1 IMPLANT
PADDING CAST COTTON 4X4 STRL (CAST SUPPLIES) ×2
PENCIL BUTTON HOLSTER BLD 10FT (ELECTRODE) IMPLANT
SLING ARM FOAM STRAP LRG (SOFTGOODS) IMPLANT
SPLINT PLASTER CAST XFAST 3X15 (CAST SUPPLIES) ×7 IMPLANT
SPLINT PLASTER XTRA FASTSET 3X (CAST SUPPLIES) ×7
STOCKINETTE 6  STRL (DRAPES) ×1
STOCKINETTE 6 STRL (DRAPES) ×1 IMPLANT
SUT PROLENE 6 0 P 1 18 (SUTURE) IMPLANT
SUT SUPRAMID 3-0 (SUTURE) IMPLANT
SUT VICRYL RAPIDE 4/0 PS 2 (SUTURE) IMPLANT
SYR BULB EAR ULCER 3OZ GRN STR (SYRINGE) ×1 IMPLANT
TOWEL GREEN STERILE FF (TOWEL DISPOSABLE) ×1 IMPLANT
TUBE CONNECTING 20X1/4 (TUBING) IMPLANT
UNDERPAD 30X36 HEAVY ABSORB (UNDERPADS AND DIAPERS) ×1 IMPLANT

## 2021-12-15 NOTE — Anesthesia Procedure Notes (Signed)
Procedure Name: LMA Insertion Date/Time: 12/15/2021 4:27 PM  Performed by: Aundria Rud, CRNAPre-anesthesia Checklist: Patient identified, Emergency Drugs available, Suction available and Patient being monitored Patient Re-evaluated:Patient Re-evaluated prior to induction Oxygen Delivery Method: Circle System Utilized Preoxygenation: Pre-oxygenation with 100% oxygen Induction Type: IV induction Ventilation: Mask ventilation without difficulty LMA: LMA inserted LMA Size: 5.0 Number of attempts: 1 Airway Equipment and Method: Bite block Placement Confirmation: positive ETCO2 Tube secured with: Tape Dental Injury: Teeth and Oropharynx as per pre-operative assessment

## 2021-12-15 NOTE — ED Provider Notes (Signed)
  Physical Exam  BP 128/80   Pulse 80   Temp 98.6 F (37 C) (Oral)   Resp 16   Ht 5\' 11"  (1.803 m)   Wt 84.1 kg   SpO2 98%   BMI 25.86 kg/m   Physical Exam Constitutional:      General: He is not in acute distress.    Appearance: Normal appearance.  HENT:     Head: Normocephalic and atraumatic.     Nose: No congestion or rhinorrhea.  Eyes:     General:        Right eye: No discharge.        Left eye: No discharge.     Extraocular Movements: Extraocular movements intact.     Pupils: Pupils are equal, round, and reactive to light.  Cardiovascular:     Rate and Rhythm: Normal rate and regular rhythm.     Heart sounds: No murmur heard. Pulmonary:     Effort: No respiratory distress.     Breath sounds: No wheezing or rales.  Abdominal:     General: There is no distension.     Tenderness: There is no abdominal tenderness.  Musculoskeletal:        General: Signs of injury present. Normal range of motion.     Cervical back: Normal range of motion.  Skin:    General: Skin is warm and dry.  Neurological:     General: No focal deficit present.     Mental Status: He is alert.     Procedures  Procedures  ED Course / MDM   Clinical Course as of 12/15/21 0847  [facility] Dec 15, 2021  Dec 17, 2021 Patient's mother, [age], updated on patient's situation, as well as IVC status. [RS]  0518 Consult called to Dr. 0519 who recommends admission to medicine as indicated and states he will inform Dr. Sherilyn Dacosta who will consult on the patient today.  Recommends closure of the wound with simple interrupted sutures, and volar resting splint.  I appreciate his collaboration in the care of this patient. [RS]    Clinical Course User Index [RS] Sponseller, Janee Morn, PA-C   Medical Decision Making Amount and/or Complexity of Data Reviewed Labs: ordered. Radiology: ordered.  Risk Prescription drug management.   Patient received in handoff.  IVC in place with multiple hand and forearm  lacerations repaired by providers overnight.  Plan at signout is for hand surgery evaluation and recommendations by Dr. Eugene Gavia in the morning.  I spoke with Janee Morn who spoke with Dr. Dale  and the patient is scheduled for surgery at 3 PM today.  Patient's IVC remains in place and will still require care coordination after surgery and although previous note states that the patient does not have a medical reason for admission, this note was written prior to the need for surgical repair and as the patient should not return to the emergency department after receiving surgery, we will consult for admission.       Janee Morn, MD 12/15/21 [phone]

## 2021-12-15 NOTE — Op Note (Signed)
12/15/2021  5:53 PM  PATIENT:  Ian Lamb  21 y.o. male  PRE-OPERATIVE DIAGNOSIS: multiple right upper extremity lacerations, with suspected injury to the extensor tendons  POST-OPERATIVE DIAGNOSIS:  Same  PROCEDURE:   1. Right forearm extensor tendon repairs x 3 (EDC to LF, RF, & EIP)    2. Right LF extensor tendon repair, zone 3     3. Simple repair of traumatic wounds of right forearm and right LF, 4 linear cm combined  SURGEON: Carr Shartzer A. Janee Morn, MD  PHYSICIAN ASSISTANT: Danielle Rankin, OPA-C  ANESTHESIA:  general  SPECIMENS:  None  DRAINS:   None  EBL:  less than 10 mL  PREOPERATIVE INDICATIONS:  Ian Lamb is a  21 y.o. male with multiple lacerations of the right upper extremity after repeatedly punching glass.  Noted was tendon dysfunction, particularly to the long and ring fingers but also some pain and weakness to the index finger.  It was suspected that the injury was in the distal forearm, but there was also a small laceration overlying the PIP joint of the long finger to be explored as well.  The risks benefits and alternatives were discussed with the patient preoperatively including but not limited to the risks of infection, bleeding, nerve injury, cardiopulmonary complications, the need for revision surgery, among others, and the patient verbalized understanding and consented to proceed.  OPERATIVE IMPLANTS: none  OPERATIVE PROCEDURE:  After receiving prophylactic antibiotics, the patient was escorted to the operative theatre and placed in a supine position.  General anesthesia was administered.A surgical "time-out" was performed during which the planned procedure, proposed operative site, and the correct patient identity were compared to the operative consent and agreement confirmed by the circulating nurse according to current facility policy.  Following application of a tourniquet to the operative extremity, the exposed skin was pre-scrub with Hibiclens  scrub brush before being formally prepped with Chloraprep and draped in the usual sterile fashion.  The limb was exsanguinated with an Esmarch bandage and the tourniquet inflated to approximately higher than systolic BP.  The sutures closing the transverse laceration in the distal dorsal forearm were removed.  The wound was extended sharply with a scalpel approximately L1 side, distally on the other to create a Z-shaped approach.  Clot was identified and evacuated.  The deep fascia overlying the fourth compartment had been breached.  It was here that the tendons were found disrupted.  The deep fascia of the muscle compartment was opened proximally longitudinally to find the proximal stumps, and the EDC tendons to the long and ring fingers were repaired with 30 Supramid Loop suture in a method as described by Nedra Hai, followed by 6-0 Prolene locked running epitendinous repair.  In addition there was an injury to the muscle belly of the EIP and this was reapproximated to some degree with the same stitch type.the integrity of the tendons had been restored, and distally the retinaculum was split centrally longitudinally for another centimeter or so to allow for better excursion of the repair.  Attention was shifted to the long finger dorsal PIP wound which was cleansed and opened.  There was an oblique laceration to the extensor apparatus and this was reapproximated with 3-0 Supramid suture as a single simple suture.  All the wounds were copiously irrigated.  The tourniquet was released and additional hemostasis obtained.The traumatic lacerations were reapproximated with 4-0 Vicryl Rapide interrupted and running horizontal mattress sutures.  The surgical incisions were repaired with the same  suture type.  Half percent Marcaine with epinephrine was infiltrated into the subcutaneous regions on the dorsum of the long finger as well as the operative site in the forearm.A short arm splint dressing was applied with a  volar fiberglass component placing the wrist into extension and extending the MPs to neutral.  He was awakened and taken to the recovery in stable condition, breathing spontaneously.  DISPOSITION: He will be returned to the floor for continued inpatient care and ongoing psychiatric evaluation.

## 2021-12-15 NOTE — ED Notes (Signed)
Pt moved into room to complete psychiatric consult.

## 2021-12-15 NOTE — Anesthesia Preprocedure Evaluation (Addendum)
Anesthesia Evaluation  Patient identified by MRN, date of birth, ID band Patient awake    Reviewed: Allergy & Precautions, NPO status , Patient's Chart, lab work & pertinent test results  Airway Mallampati: I       Dental no notable dental hx.    Pulmonary neg pulmonary ROS, Current Smoker and Patient abstained from smoking.,    Pulmonary exam normal        Cardiovascular negative cardio ROS Normal cardiovascular exam     Neuro/Psych PSYCHIATRIC DISORDERS Anxiety Depression negative neurological ROS     GI/Hepatic negative GI ROS, Neg liver ROS,   Endo/Other  negative endocrine ROS  Renal/GU negative Renal ROS     Musculoskeletal negative musculoskeletal ROS (+)   Abdominal Normal abdominal exam  (+)   Peds  Hematology negative hematology ROS (+)   Anesthesia Other Findings   Reproductive/Obstetrics                            Anesthesia Physical Anesthesia Plan  ASA: 2  Anesthesia Plan: General   Post-op Pain Management:    Induction: Intravenous  PONV Risk Score and Plan: 2 and Ondansetron, Dexamethasone and Midazolam  Airway Management Planned: LMA  Additional Equipment: None  Intra-op Plan:   Post-operative Plan: Extubation in OR  Informed Consent: I have reviewed the patients History and Physical, chart, labs and discussed the procedure including the risks, benefits and alternatives for the proposed anesthesia with the patient or authorized representative who has indicated his/her understanding and acceptance.     Dental advisory given  Plan Discussed with: CRNA  Anesthesia Plan Comments:        Anesthesia Quick Evaluation

## 2021-12-15 NOTE — ED Notes (Signed)
Patient belongings given to mom to take home.

## 2021-12-15 NOTE — ED Notes (Signed)
Sandi Carne mother [phone] requesting an update on the patient

## 2021-12-15 NOTE — ED Provider Triage Note (Addendum)
Emergency Medicine Provider Triage Evaluation Note  Ian Lamb , a 21 y.o. male  was evaluated in triage.  Pt presents with EMS for multiple lacerations after punching his hand through a window while drunk and angry. States he drank about 1/2 of a fifth of vodka tonight and then got into an argument with his parents. Tourniquet placed on by [employer] on scene due to extent of bleeding from right wrist laceration, this was taken down at the bridge upon arrival to the ED, oozing but no arterial bleed and directed to triage where I evaluated the patient.  Per [employer] on scene patient was making suicidal threats stating he was going to slit his wrists the rest of the way and slit his neck.  Denies active SI to this provider, denies HI but was clearly intoxicated.  Review of Systems  Positive: lacerations Negative: Syncope  Physical Exam  BP 131/85   Pulse (!) 111   Temp 98.5 F (36.9 C) (Oral)   Resp 16   Ht 5\' 11"  (1.803 m)   Wt 84.1 kg   SpO2 97%   BMI 25.86 kg/m  Gen:   Awake, no distress   Resp:  Normal effort  MSK:   Moves extremities without difficulty  Other:  Multiple lacerations to the right forearm, wrist, and hand.  Laceration of primary concern is on the dorsum of the right wrist, approximately 2 cm in length, bleeding profusely.  Patient able to fully flex all digits, impaired extension of the right third and fourth digits, unclear if this is due to pain or inability to perform full extension of the fingers.  2+ radial pulses and normal capillary refill in all digits.  Normal sensation all digits.  Medical Decision Making  Medically screening exam initiated at 3:51 AM.  Appropriate orders placed.  Ian Lamb was informed that the remainder of the evaluation will be completed by another provider, this initial triage assessment does not replace that evaluation, and the importance of remaining in the ED until their evaluation is complete.  Unclear last tetanus shot, will order  booster here.  Pressure dressing applied by this provider, patient bled through, dressing taken down and direct pressure applied by this provider for 10 minutes with hemostasis, pressure dressing again applied with normal neurovascular status distal to the pressure dressing with hemostasis at this time.  This chart was dictated using voice recognition software, Dragon. Despite the best efforts of this provider to proofread and correct errors, errors may still occur which can change documentation meaning.  ADDENDUM: Patient making suicidal threats, stating will slit wrists or throat, erratic, tangential in his speech at this time.  Escalating in his agitation, making states about "hating the chinese and the Japs" and planning to make sure his step-mother "burns with lucifer right next to me". Verbally redirectable by [employer] officers, but do feel patient is a danger to himself.  IVC paperwork filed.  Patient will still require medical clearance prior to TTS evaluation.  Collateral history obtained from patient's mother ; patient follows with Dr. Sandi Carne, as well  as outpatient counseling. Patient has history of depression, episodes of rage, suicidality in the past. Family states patient has had ongoing challenges with these types of behaviors.    Michae Kava, PA-C 12/15/21 0356         Corey Laski, 12/17/21, PA-C 12/15/21 [phone]

## 2021-12-15 NOTE — ED Provider Notes (Cosign Needed Addendum)
[facility] EMERGENCY DEPARTMENT Provider Note   CSN: [id number] Arrival date & time: 12/15/21  2956     History  Chief Complaint  Patient presents with   Laceration    Roper A Paragas IV is a 21 y.o. male initially seen by this provider in triage.  Please see associated note for initial evaluation.  In brief patient is a 73 male who presented via EMS after punching through a glass window in his home while intoxicated with alcohol.  Multiple lacerations to the right hand, proximal right forearm, and severe laceration to the dorsal right wrist bleeding profusely at time of arrival.  Pressure dressing placed in triage with hemostasis.  I personally viewed the patient's medical records he has history of anxiety and depression as well as binge drinking episodes per family.  Unknown last tetanus vaccination.  Effexor daily, not taking Adderall listed in the chart.  HPI     Home Medications Prior to Admission medications   Medication Sig Start Date End Date Taking? Authorizing Provider  amphetamine-dextroamphetamine (ADDERALL) 10 MG tablet Take 1 tablet (10 mg total) by mouth 2 (two) times daily with a meal. Patient not taking: Reported on 10/30/2021 08/07/21 08/07/22  Oletta Darter, MD  doxylamine, Sleep, (UNISOM) 25 MG tablet Take 1 tablet (25 mg total) by mouth at bedtime as needed. 10/30/21   Oletta Darter, MD  propranolol (INDERAL) 20 MG tablet Take 1 tablet (20 mg total) by mouth 2 (two) times daily as needed (anxiety). 10/30/21   Oletta Darter, MD  venlafaxine XR (EFFEXOR XR) 75 MG 24 hr capsule Take 3 capsules (225 mg total) by mouth daily. 10/30/21 10/30/22  Oletta Darter, MD      Allergies    Shrimp (diagnostic), Sulfamethoxazole-trimethoprim, and Watermelon [citrullus vulgaris]    Review of Systems   Review of Systems  Skin:  Positive for wound.  Psychiatric/Behavioral:  Positive for agitation, behavioral problems and suicidal ideas.     Physical  Exam Updated Vital Signs BP 133/83   Pulse 87   Temp 98.5 F (36.9 C) (Oral)   Resp 16   Ht  (1.803 m)   Wt 84.1 kg   SpO2 97%   BMI 25.86 kg/m  Physical Exam Vitals and nursing note reviewed.  Constitutional:      Appearance: He is not ill-appearing or toxic-appearing.  HENT:     Head: Normocephalic and atraumatic.     Mouth/Throat:     Mouth: Mucous membranes are moist.     Pharynx: No oropharyngeal exudate or posterior oropharyngeal erythema.  Eyes:     General:        Right eye: No discharge.        Left eye: No discharge.     Extraocular Movements: Extraocular movements intact.     Conjunctiva/sclera: Conjunctivae normal.     Pupils: Pupils are equal, round, and reactive to light.  Cardiovascular:     Rate and Rhythm: Normal rate and regular rhythm.     Pulses: Normal pulses.     Heart sounds: Normal heart sounds. No murmur heard. Pulmonary:     Effort: Pulmonary effort is normal. No respiratory distress.     Breath sounds: Normal breath sounds. No wheezing or rales.  Abdominal:     General: There is no distension.     Palpations: Abdomen is soft.     Tenderness: There is no abdominal tenderness. There is no guarding or rebound.  Musculoskeletal:  General: No deformity.       Arms:       Hands:     Cervical back: Neck supple.     Right lower leg: No edema.     Left lower leg: No edema.     Comments: 2+ radial pulses bilaterally.   Skin:    General: Skin is warm and dry.     Capillary Refill: Capillary refill takes less than 2 seconds.     Findings: Laceration present.  Neurological:     General: No focal deficit present.     Mental Status: He is alert and oriented to person, place, and time. Mental status is at baseline.  Psychiatric:        Attention and Perception: He does not perceive auditory or visual hallucinations.        Mood and Affect: Mood normal. Affect is labile.        Speech: Speech is rapid and pressured and tangential.         Behavior: Behavior is hyperactive and combative.        Thought Content: Thought content includes suicidal ideation. Thought content includes suicidal plan.     Comments: Acutely intoxicated, does not appear to be responding to internal stimuli     ED Results / Procedures / Treatments   Labs (all labs ordered are listed, but only abnormal results are displayed) Labs Reviewed  BASIC METABOLIC PANEL - Abnormal; Notable for the following components:      Result Value   CO2 20 (*)    Glucose, Bld 114 (*)    All other components within normal limits  ETHANOL - Abnormal; Notable for the following components:   Alcohol, Ethyl (B) 209 (*)    All other components within normal limits  RAPID URINE DRUG SCREEN, HOSP PERFORMED - Abnormal; Notable for the following components:   Tetrahydrocannabinol POSITIVE (*)    All other components within normal limits  ACETAMINOPHEN LEVEL - Abnormal; Notable for the following components:   Acetaminophen (Tylenol), Serum <10 (*)    All other components within normal limits  SALICYLATE LEVEL - Abnormal; Notable for the following components:   Salicylate Lvl <7.0 (*)    All other components within normal limits  RESP PANEL BY RT-PCR (FLU A&B, COVID) ARPGX2  CBC WITH DIFFERENTIAL/PLATELET    EKG None  Radiology DG Hand Complete Right  Result Date: 12/15/2021 CLINICAL DATA:  Laceration.  Punched hand through glass. EXAM: RIGHT HAND - COMPLETE 3+ VIEW COMPARISON:  None FINDINGS: Bandage material is identified overlying the second and third and fourth and fifth phalanges. Signs of soft tissue laceration noted overlying the ulnar side of the wrist and fifth digit. Punctate radiodensity adjacent to the fifth PIP joint may represent small retained foreign body. No underlying acute osseous abnormality. No signs of fracture or dislocation. IMPRESSION: 1. No acute osseous abnormality. 2. Soft tissue laceration overlying the ulnar side of the wrist and fifth digit.  3. Punctate radiodensity adjacent to the fifth PIP joint may represent small retained foreign body. Electronically Signed   By: Signa Kellaylor  Stroud M.D.   On: 12/15/2021 05:21   DG Forearm Right  Result Date: 12/15/2021 CLINICAL DATA:  Punched through glass EXAM: RIGHT FOREARM - 2 VIEW COMPARISON:  None Available. FINDINGS: Swelling with bandage at the distal forearm. No opaque foreign body when accounting for external artifact. No acute fracture or dislocation. IMPRESSION: Negative for fracture or opaque foreign body. Electronically Signed   By: Tiburcio PeaJonathan  Watts  M.D.   On: 12/15/2021 05:19    Procedures .Marland KitchenLaceration Repair  Date/Time: 12/15/2021 7:10 AM  Performed by: Paris Lore, PA-C Authorized by: Paris Lore, PA-C   Consent:    Consent obtained:  Verbal   Consent given by:  Patient   Risks discussed:  Infection, need for additional repair, pain, poor cosmetic result and poor wound healing   Alternatives discussed:  No treatment and delayed treatment Universal protocol:    Procedure explained and questions answered to patient or proxy's satisfaction: yes     Relevant documents present and verified: yes     Test results available: yes     Imaging studies available: yes     Required blood products, implants, devices, and special equipment available: yes     Site/side marked: yes     Immediately prior to procedure, a time out was called: yes     Patient identity confirmed:  Verbally with patient Anesthesia:    Anesthesia method:  Local infiltration   Local anesthetic:  Lidocaine 2% WITH epi Laceration details:    Location:  Hand   Hand location:  R wrist (Dorsum)   Length (cm):  1.5   Laceration depth: Extensor tendon involvement. Pre-procedure details:    Preparation:  Imaging obtained to evaluate for foreign bodies Exploration:    Imaging obtained: x-ray     Imaging outcome: foreign body not noted     Wound exploration: wound explored through full range of  motion and entire depth of wound visualized     Wound extent: tendon damage     Wound extent: no foreign bodies/material noted and no underlying fracture noted     Tendon damage location:  Upper extremity   Upper extremity tendon damage location:  Finger extensor   Tendon repair plan: Hand surgeon to see in the ED.   Contaminated: no   Treatment:    Area cleansed with:  Betadine and saline   Amount of cleaning:  Extensive   Irrigation solution:  Sterile saline Skin repair:    Repair method:  Sutures   Suture size:  5-0   Suture material:  Prolene   Suture technique:  Simple interrupted   Number of sutures:  4 Approximation:    Approximation:  Close Repair type:    Repair type:  Simple Post-procedure details:    Dressing:  Antibiotic ointment and non-adherent dressing   Procedure completion:  Tolerated well, no immediate complications .Marland KitchenLaceration Repair  Date/Time: 12/15/2021 7:13 AM  Performed by: Paris Lore, PA-C Authorized by: Paris Lore, PA-C   Consent:    Consent obtained:  Verbal   Consent given by:  Patient   Risks discussed:  Infection, need for additional repair, pain, poor cosmetic result and poor wound healing   Alternatives discussed:  No treatment and delayed treatment Universal protocol:    Procedure explained and questions answered to patient or proxy's satisfaction: yes     Relevant documents present and verified: yes     Test results available: yes     Imaging studies available: yes     Required blood products, implants, devices, and special equipment available: yes     Site/side marked: yes     Immediately prior to procedure, a time out was called: yes     Patient identity confirmed:  Verbally with patient Anesthesia:    Anesthesia method:  Local infiltration   Local anesthetic:  Lidocaine 2% WITH epi Laceration details:    Location:  Shoulder/arm  Shoulder/arm location:  R lower arm   Length (cm):  0.5 Pre-procedure details:     Preparation:  Imaging obtained to evaluate for foreign bodies Exploration:    Imaging obtained: x-ray     Imaging outcome: foreign body not noted     Wound extent: no foreign bodies/material noted, no tendon damage noted and no underlying fracture noted     Contaminated: no   Treatment:    Area cleansed with:  Betadine and saline   Amount of cleaning:  Standard   Irrigation solution:  Sterile saline Skin repair:    Repair method:  Sutures   Suture size:  5-0   Suture material:  Prolene   Suture technique:  Simple interrupted   Number of sutures:  1 Approximation:    Approximation:  Close Repair type:    Repair type:  Simple Post-procedure details:    Dressing:  Antibiotic ointment and non-adherent dressing   Procedure completion:  Tolerated well, no immediate complications .Marland KitchenLaceration Repair  Date/Time: 12/15/2021 7:14 AM  Performed by: Paris Lore, PA-C Authorized by: Paris Lore, PA-C   Consent:    Consent obtained:  Verbal   Consent given by:  Patient   Risks discussed:  Infection, need for additional repair, pain, poor cosmetic result and poor wound healing   Alternatives discussed:  No treatment and delayed treatment Universal protocol:    Procedure explained and questions answered to patient or proxy's satisfaction: yes     Relevant documents present and verified: yes     Test results available: yes     Imaging studies available: yes     Required blood products, implants, devices, and special equipment available: yes     Site/side marked: yes     Immediately prior to procedure, a time out was called: yes     Patient identity confirmed:  Verbally with patient Anesthesia:    Anesthesia method:  Local infiltration   Local anesthetic:  Lidocaine 2% w/o epi Laceration details:    Location:  Shoulder/arm   Shoulder/arm location:  R lower arm   Length (cm):  0.5 (2 lacerations x 0.5 cm each, adjacent) Pre-procedure details:    Preparation:   Imaging obtained to evaluate for foreign bodies Exploration:    Imaging obtained: x-ray     Imaging outcome: foreign body not noted     Wound extent: no foreign bodies/material noted, no tendon damage noted and no underlying fracture noted   Treatment:    Area cleansed with:  Betadine and saline   Amount of cleaning:  Standard   Irrigation solution:  Sterile saline Skin repair:    Repair method:  Sutures   Suture size:  5-0   Suture material:  Prolene   Suture technique:  Simple interrupted   Number of sutures:  2 (One in each laceraiton) Approximation:    Approximation:  Close Repair type:    Repair type:  Simple Post-procedure details:    Dressing:  Antibiotic ointment and non-adherent dressing   Procedure completion:  Tolerated well, no immediate complications .Marland KitchenLaceration Repair  Date/Time: 12/15/2021 7:15 AM  Performed by: Paris Lore, PA-C Authorized by: Paris Lore, PA-C   Consent:    Consent obtained:  Verbal   Consent given by:  Patient   Risks discussed:  Infection, need for additional repair, pain, poor cosmetic result and poor wound healing   Alternatives discussed:  No treatment and delayed treatment Universal protocol:    Procedure explained and questions answered to  patient or proxy's satisfaction: yes     Relevant documents present and verified: yes     Test results available: yes     Imaging studies available: yes     Required blood products, implants, devices, and special equipment available: yes     Site/side marked: yes     Immediately prior to procedure, a time out was called: yes     Patient identity confirmed:  Verbally with patient Anesthesia:    Anesthesia method:  Local infiltration   Local anesthetic:  Lidocaine 1% w/o epi Laceration details:    Location:  Finger   Finger location:  R small finger   Length (cm):  0.5 (2 lacerations x 0.5 cm each) Exploration:    Imaging obtained: x-ray     Imaging outcome: foreign body  noted (wound irrigated thoroughly, no visible FB following irrigation)     Wound extent: no foreign bodies/material noted, no tendon damage noted and no underlying fracture noted   Treatment:    Area cleansed with:  Betadine   Amount of cleaning:  Standard   Irrigation solution:  Sterile saline Skin repair:    Repair method:  Sutures   Suture material:  Prolene   Suture technique:  Simple interrupted   Number of sutures:  2 (one in each laceration) Approximation:    Approximation:  Close Post-procedure details:    Dressing:  Antibiotic ointment and non-adherent dressing   Procedure completion:  Tolerated well, no immediate complications .Marland KitchenLaceration Repair  Date/Time: 12/15/2021 7:17 AM  Performed by: Paris Lore, PA-C Authorized by: Paris Lore, PA-C   Consent:    Consent obtained:  Verbal   Consent given by:  Patient   Risks discussed:  Infection, need for additional repair, pain, poor cosmetic result and poor wound healing   Alternatives discussed:  No treatment and delayed treatment Universal protocol:    Procedure explained and questions answered to patient or proxy's satisfaction: yes     Relevant documents present and verified: yes     Test results available: yes     Imaging studies available: yes     Required blood products, implants, devices, and special equipment available: yes     Site/side marked: yes     Immediately prior to procedure, a time out was called: yes     Patient identity confirmed:  Verbally with patient Anesthesia:    Anesthesia method:  Local infiltration   Local anesthetic:  Lidocaine 1% w/o epi Laceration details:    Location:  Hand   Hand location:  R hand, dorsum (2nd MCP)   Length (cm):  1 Pre-procedure details:    Preparation:  Imaging obtained to evaluate for foreign bodies Exploration:    Imaging obtained: x-ray     Imaging outcome: foreign body not noted     Wound extent: no foreign bodies/material noted, no tendon  damage noted and no underlying fracture noted     Contaminated: no   Treatment:    Area cleansed with:  Saline and Betadine   Amount of cleaning:  Standard   Irrigation solution:  Sterile saline Skin repair:    Repair method:  Sutures   Suture size:  5-0   Suture material:  Prolene   Suture technique:  Simple interrupted   Number of sutures:  2 Approximation:    Approximation:  Close Post-procedure details:    Dressing:  Antibiotic ointment and non-adherent dressing   Procedure completion:  Tolerated well, no immediate complications .Critical Care  Performed by: Paris Lore, PA-C Authorized by: Paris Lore, PA-C   Critical care provider statement:    Critical care time (minutes):  45   Critical care was time spent personally by me on the following activities:  Development of treatment plan with patient or surrogate, discussions with consultants, evaluation of patient's response to treatment, examination of patient, obtaining history from patient or surrogate, ordering and performing treatments and interventions, ordering and review of laboratory studies, ordering and review of radiographic studies, pulse oximetry and re-evaluation of patient's condition     Medications Ordered in ED Medications  lidocaine-EPINEPHrine (XYLOCAINE W/EPI) 2 %-1:200000 (PF) injection 10 mL (has no administration in time range)  lidocaine (PF) (XYLOCAINE) 1 % injection 30 mL (has no administration in time range)  Tdap (BOOSTRIX) injection 0.5 mL (0.5 mLs Intramuscular Given 12/15/21 0406)    ED Course/ Medical Decision Making/ A&P Clinical Course as of 12/15/21 0730  Mon Dec 15, 2021  2025 Patient's mother, Sandi Carne, updated on patient's situation, as well as IVC status. [RS]  0518 Consult called to Dr. Sherilyn Dacosta who recommends admission to medicine as indicated and states he will inform Dr. Janee Morn who will consult on the patient today.  Recommends closure of the wound with simple  interrupted sutures, and volar resting splint.  I appreciate his collaboration in the care of this patient. [RS]    Clinical Course User Index [RS] Kazmir Oki, Eugene Gavia, PA-C                           Medical Decision Making 20 year old male who presents with concern for lacerations.   Tachycardic on intake, cardiopulmonary exam is normal at this. Abdominal exam is benign. Head is atraumatic. Hand with multiple lacerations as above, concern for dorsal right wrist laceration involving extensor tendons, impaired extension of the right long and ring finger.  Initially combative, threatening, verbalizing intent for suicide by slitting his throat or wrist.  Appears acutely intoxicated but does not appear to be responding to internal stimuli at this time.  Amount and/or Complexity of Data Reviewed Labs: ordered.    Details: CBC without leukocytosis or anemia, BMP with mildly low bicarb of 20.  Acetaminophen and salicylate levels are normal, ethanol level elevated to 209.  UDS positive for THC.   Radiology: ordered and independent interpretation performed.    Details: Plain films of the right hand with punctate radiodensity at the fifth PIP joint at site of laceration possible retained foreign body otherwise unremarkable no osseous abnormality.  Right forearm negative for acute fracture or radiopaque foreign body, visualized by this provider.  Risk Prescription drug management.   Consult to orthopedics as above.  Wounds repaired as above with the assistance of Arthor Captain, PA-C.  Neurovascular status remains normal after repairs.   Case discussuced with attending physican Dr. Clayborne Dana -No indication for medical admission at this time.  Patient to remain boarding in the emergency department for psychiatric evaluation and hand surgeon consultation for tendinous injury at this time.   Patient initially combative and threatening to staff required [employer] escort, however after metabolization of alcohol,  and cooperative for this provider at time of  [place]  voiced understanding of his medical evaluation and treatment plan. Each of their questions answered to their expressed satisfaction.  Return precautions were given.  Patient is well-appearing, stable, and was discharged in good condition.  This chart was dictated using voice recognition software, Dragon. Despite  the best efforts of this provider to proofread and correct errors, errors may still occur which can change documentation meaning.  Final Clinical Impression(s) / ED Diagnoses Final diagnoses:  None    Rx / DC Orders ED Discharge Orders     None         Paris Lore, PA-C 12/15/21 0730    Siddh Vandeventer, Eugene Gavia, PA-C 12/15/21 0750    Marily Memos, MD 12/16/21 0045

## 2021-12-15 NOTE — Transfer of Care (Signed)
Immediate Anesthesia Transfer of Care Note  Patient: Ian Lamb  Procedure(s) Performed: RIGHT HAND TENDON REPAIR (Right)  Patient Location: PACU  Anesthesia Type:General  Level of Consciousness: awake, alert , oriented and patient cooperative  Airway & Oxygen Therapy: Patient Spontanous Breathing  Post-op Assessment: Report given to RN, Post -op Vital signs reviewed and stable and Patient moving all extremities  Post vital signs: Reviewed and stable  Last Vitals:  Vitals Value Taken Time  BP    Temp    Pulse    Resp    SpO2      Last Pain:  Vitals:   12/15/21 1340  TempSrc: Oral  PainSc:          Complications: No notable events documented.

## 2021-12-15 NOTE — Discharge Instructions (Signed)
Orthopedics Keep R UE splint/dressing intact, clean and dry  Psychiatry 1) If you are having any suicidal thoughts or feelings (or feel like you cannot keep yourself safe), please call 911 or [phone], or go to the nearest emergency room. [facility] ([address], [place], [place] [address]) is a good alternative to urgent care. 2) Keep your appointment with Dr. Michae Kava on Thursday. 3) You should be getting a call from [facility]. They will require you to go to AA groups as part of treatment if accepted into their program. I would suggest starting with the "Young and Sober" group at the link to the right --> [email or web address]://www.young.biz/  4) If [facility] determines that you are not a good fit, the next step would probably be [place] CD-IOP.  5) There are some generic resources for substance use below (including other intensive outpt programs for substance use). The mobile crisis response team below is probably your best bet if things get out of control 6) You should be getting a prescription for naltrexone when you get out of here. You should WAIT until you have been free of prescription pain medications for 24 hours, and then take this medication daily with food to help with alcohol.  6) Best of luck on your journey!  EMERGENCY Suicide hotline: 988 Emergency: 911 [facility] URGENT CARE: [address] FIRST FLOOR. [place], [place] [address]. [phone]  Mobile Crisis Response Teams Listed by counties in vicinity of [facility] providers [facility] [phone] [facility] [phone] [facility] [phone] [facility] [phone] [place]                * [facility] [phone]                * [facility] [phone]  [facility] [phone] [facility]  [phone] * [facility] [phone]  Dear @PREFFIRSTNAME @,  Please make regular appointments with an outpatient psychiatrist and other doctors once you leave the hospital (if any, otherwise, please see below for resources to make an appointment).   To see which pharmacy near you is the CHEAPEST for certain medications, please use GoodRx. It is free website and has a free phone app.    Also consider looking at [facility] $4.00 or [facility]'s $7.00 prescription list. Both are free to view if googled "[facility]mart $4 prescription" and "public's $7 prescription". These are set prices, no insurance required. [facility]'s low cost medications: $4-$15 for 30days prescriptions or $10-$38 for 90days prescriptions  For issues with sleep, please use this free app for insomnia called CBT-I. Let your doctors and therapists know so they can help with extra tips and tricks or for guidance and accountability. NO ADDS on the app.     Intensive Outpatient Programs for Substance Use Treatment  [facility] [address] [place],  [place] [phone] phone  [facility] [address] [phone]. [place], [place], [place] [phone] phone [phone] fax  [facility] [address] [place], [place], [place] [phone] phone  [facility] ([facility]) [address], [place], [place] [phone] phone  [facility] [address] [place], [place], [place] [phone] phone [phone] fax  [facility], [facility] [address], [place], [place] [phone] phone  [facility] [phone] [address], [address] [place] [address], [place] [phone] phone [phone] fax  Substance Abuse Resources  [facility] - Admissions are currently completed Monday through Friday at 8am; both appointments and walk-ins are accepted.  Any  individual that is a [facility] resident may present for a substance abuse screening and assessment for admission.  A person may be referred by numerous sources or self-refer.   Potential clients will be screened for medical necessity and appropriateness for the program.  Clients must meet criteria for high-intensity residential treatment services.  If clinically appropriate, a client will continue with the comprehensive clinical assessment and intake process, as well as enrollment in the [facility].   Address: [address] [place], [place] [address] Admin Hours: Mon-Fri 8AM to [facility] Hours: 24/7 Phone: [phone] Fax: [phone]   [facility] ([facility]) Facility Based Crisis:  These are 3 locations for services: Please call before arrival    Address: [address] [place], [place] [address] Phone: [phone]   Address: [address] [place], [place] [address] Phone#: [phone]   Address: [address] [place], [place] [address] Phone#: [phone]     [facility] ([facility]): (offers outpatient therapy and intensive outpatient substance abuse therapy).  [address], [place], [place] [address] Phone: [phone]   [facility]: Offers FREE recovery skills classes, support groups, 1:1 Peer Support, and Compeer Classes. [address], [place], [place] [address] Phone: [phone] (Call to complete intake).  [facility] [address] [place], [place] [address] Phone: [phone] ext: [phone] [facility] provides food, shelter and other programs and services to the homeless men of [place] through our [employer].   By offering safe shelter, three meals a day, clean clothing, Biblical counseling, financial planning, vocational training, GED/education and employment assistance, we've helped mend the shattered lives of many homeless men since  opening in 1974.   We have approximately 267 beds available, with a max of 312 beds including mats for emergency situations and currently house an average of 270 men a night.   Prospective Client Check-In Information Photo ID Required (State/ Out of State/ [facility]) - if photo ID is not available, clients are required to have a printout of a police/sheriff's criminal history report. Help out with chores around the [facility]. No sex offender of any type (pending, charged, registered and/or any other sex related offenses) will be permitted to check in. Must be willing to abide by all rules, regulations, and policies established by the [employer]. The following will be provided - shelter, food, clothing, and biblical counseling. If you or someone you know is in need of assistance at our [facility] shelter in [place], [place], please call [phone] ext. [phone].   [facility]-will provide timely access to mental health services for children and adolescents (4-17) and adults presenting in a mental health crisis. The program is designed for those who need urgent Behavioral Health or Substance Use treatment and are not experiencing a medical crisis that would typically require an emergency room visit.    [address] [place], [place] [address] Phone: [phone] [email or web address]   [facility]: Phone#: [phone]   [facility] Intensive Outpatient Program (SAIOP) means structured individual and group addiction activities and services that are provided at an outpatient program designed to assist adult and adolescent consumers to begin recovery and learn skills for recovery maintenance. [facility] SAIOP program is offered  at least 3 hours a day, 3 days a week.SAIOP services shall include a structured program consisting of, but not limited to, the following services: Individual counseling and support; Group counseling and  support; Family counseling, training or support; Biochemical assays to identify recent drug use (e.g., urine drug screens); Strategies for relapse prevention to include community and social support systems in treatment; Life skills; Crisis contingency planning; Disease Management; and Treatment support activities that have been adapted or specifically designed for persons with physical disabilities, or persons with co-occurring disorders of mental illness and substance abuse/dependence or mental retardation/developmental disability and substance abuse/dependence. Phone: [phone]   [facility] [address] [place], [place] [address] Phone: [phone] Admissions team is available 24/7  Phone: [phone]. Fax: [phone]  [facility]     Admissions [address], [place], [place] [address] [phone] [facility] 24-Hour Call Center: [phone]  Behavioral Health Crisis Line: [phone]
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
## 2021-12-15 NOTE — Progress Notes (Signed)
Pt arrived to 5N07, alert and oriented, mom at the bedside. Pt identified appropriately, VS stable. No signs of acute distress. Cardiac monitor connected and CCMD notified. Sitter at the bedside. Room adjusted to fit suicide case patient. Pt belongings not in a room. Pt oriented to room and equipment, instructed to call for assistance. Will continue to monitor pt and treat per MD orders.

## 2021-12-15 NOTE — Progress Notes (Signed)
R UE surgery concluded.  Would generally have been done as outpatient and patient d/c home from PACU.  My office would have called patient to arrange for an appt with our hand therapist for a special splint to be constructed and also to begin structured rehab for extensor tendon repairs.  This appt would generally be in 5-10 days following surgery, then f/u with me at 10-15 days following surgery.  I will have to follow patient's progress to determine how to proceed in light of him not being at home.  Neil Crouch, MD Hand Surgery

## 2021-12-15 NOTE — ED Notes (Signed)
Pt updated regarding plan of care. Pt agreeable, has no questions at this time. Resting in bed. [employer] at bedside.

## 2021-12-15 NOTE — Progress Notes (Signed)
Staffing made aware that the pt is under IVC order and he does not have an 1:1 sitter. Staffing is aware and are trying to get assistance as soon as possible. Mom at the bedside.

## 2021-12-15 NOTE — ED Notes (Signed)
Per [employer] pt was making SI on scene. PT was talking about slitting his neck and wrists. Pt denies SI here
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
## 2021-12-15 NOTE — Consult Note (Signed)
[facility] Psychiatry New Face-to-Face Psychiatric Evaluation   Service Date: December 15, 2021 LOS:  LOS: 0 days    Assessment  Ian Lamb is a 21 y.o. male admitted medically for 12/15/2021  3:14 AM for Several hand lacerations. He carries the psychiatric diagnoses of Anxiety, depression, Insomnia, ADHD, and binge drinking and has no past medical history.Psychiatry was consulted for binge drinking and depression by Glendora Score MD emergency medicine.    His current presentation of ongoing alcohol abuse despite several attempts to stop in the setting of significiant social costs is consistent with an alcohol use disorder. He also meets criteria for MDD which by all accounts predated the EtOH use disorder.  Current outpatient psychotropic medications include propranalol, unisom, and venlafaxine; he is mostly compliant with venlafaxine and takes the others as needed. Pt and family report a fair response to these medications; his mood does seem to be improved but he continues to drink to excess, which leads to episodes of agitation. On initial examination, patient is alert and oriented X3 and expresses and interest in maintaining sobriety and undergoing rehab. Please see plan below for detailed recommendations.   Diagnoses:  Active Hospital problems: Principal Problem:   Laceration of forearm     Plan  ## Safety and Observation Level:  - Based on my clinical evaluation, I estimate the patient to be at moderate risk of self harm in the current setting - At this time, we recommend a routine level of observation. This decision is based on my review of the chart including patient's history and current presentation, interview of the patient, mental status examination, and consideration of suicide risk including evaluating suicidal ideation, plan, intent, suicidal or self-harm behaviors, risk factors, and protective factors. This judgment is based on our ability to directly address suicide  risk, implement suicide prevention strategies and develop a safety plan while the patient is in the clinical setting. Please contact our team if there is a concern that risk level has changed.   ## Medications:  -- We will make medication recommendations after his hand surgery. -- continue home venlafaxine   ## Medical Decision Making Capacity:  Not formally assessed  ## Further Work-up:  -- None at this time   -- most recent EKG on 05/27/21 had QtC of 392 -- Pertinent labwork reviewed earlier this admission includes: ETOH-209 UDS positive for [facility]  ## Disposition:  -- Currently between home with resources and outpatient rehab. We will make further plans once patient has recovered from surgery.   ## Behavioral / Environmental:  -- under IVC  ##Legal Status - IVC in place, will reassess need after surgery after pt has fully metabolized EtOH  Thank you for this consult request. Recommendations have been communicated to the primary team.  We will follow up after surgery at this time.   Jake Shark, Medical Student   NEW history  Relevant Aspects of Hospital Course:  Admitted on 12/15/2021 for multiple lacerations of the hand.  Patient Report:  Patient reports he was drinking last night. He had bought the alcohol for a Carbary's 20st birthday. He woke up his parents drinking. There was an argument and he punched a window as an attempt to de-escalate himself. He was tackled by his stepfather afterward to prevent further harm to himself. His longest period of sobriety was a few weeks, and his is triggered by seeing his mother and stepfather drink in front of him. States he feels guilty about relapsing. He  reports a history of chronic passive SI.   ROS:  Endorses significant guilt. Currently denies SI, HI, AVH.   Collateral information:  Obtained from Clemens Catholic: She confirmed that [name] was intoxicated last night when her and her husband were woken up by [name]. He was hostile  toward family and punched a glass pane door. His stepfather restrained him for his safety until EMS arrived. Mother mentioned he was never this "extreme". She states he has been compliant with Effexor. She is worried about his mood, as he gets "stuck" on his past and has a lot of guilt. She has been looking for inpatient assistance for him, which he has not previously qualified for.   Psychiatric History:  Information collected from patient  Family psych history: Alcohol abuse-Biological father and mother.   Social History:  Lives with mom, bounces between mom and dad Works for [employer]  Tobacco use: None Alcohol use: Binge drinking.  No hx EtOH w/d seizures or other signs of withdrawal.  Drug use: Smokes weed, doesn't like edibles (too strong, "the crack of weed"). Uses delta-8, delta-9; weed use significantly increases when drinking liquor (Dr. Claris Che Cinderella)    Family History:  The patient's family history includes Bipolar disorder in his maternal uncle. Also includes alcohol abuse in both biological parents.   Medical History: Past Medical History:  Diagnosis Date   Abscess 2011   Left calf   ADD (attention deficit disorder)    Anxiety    Depression     Surgical History: Past Surgical History:  Procedure Laterality Date   MOUTH SURGERY     in childhood   NO PAST SURGERIES      Medications:   Current Facility-Administered Medications:    0.9 %  sodium chloride infusion, , Intravenous, Continuous, Smith, Rondell A, MD, Last Rate: 75 mL/hr at 12/15/21 1130, New Bag at 12/15/21 1130   acetaminophen (TYLENOL) tablet 650 mg, 650 mg, Oral, Q6H PRN **OR** acetaminophen (TYLENOL) suppository 650 mg, 650 mg, Rectal, Q6H PRN, Katrinka Blazing, Rondell A, MD   albuterol (PROVENTIL) (2.5 MG/3ML) 0.083% nebulizer solution 2.5 mg, 2.5 mg, Nebulization, Q6H PRN, Katrinka Blazing, Rondell A, MD   enoxaparin (LOVENOX) injection 40 mg, 40 mg, Subcutaneous, Q24H, Smith, Rondell A, MD, 40  mg at 12/15/21 1124   folic acid (FOLVITE) tablet 1 mg, 1 mg, Oral, Daily, Katrinka Blazing, Rondell A, MD, 1 mg at 12/15/21 1125   HYDROcodone-acetaminophen (NORCO/VICODIN) 5-325 MG per tablet 1-2 tablet, 1-2 tablet, Oral, Q4H PRN, Smith, Rondell A, MD   lidocaine (PF) (XYLOCAINE) 1 % injection 30 mL, 30 mL, Other, Once, Sponseller, Rebekah R, PA-C   lidocaine-EPINEPHrine (XYLOCAINE W/EPI) 2 %-1:200000 (PF) injection 10 mL, 10 mL, Intradermal, Once, Sponseller, Rebekah R, PA-C   LORazepam (ATIVAN) injection 0-4 mg, 0-4 mg, Intravenous, Q6H **FOLLOWED BY** [START ON 12/17/2021] LORazepam (ATIVAN) injection 0-4 mg, 0-4 mg, Intravenous, Q12H, Smith, Rondell A, MD   LORazepam (ATIVAN) tablet 1-4 mg, 1-4 mg, Oral, Q1H PRN **OR** LORazepam (ATIVAN) injection 1-4 mg, 1-4 mg, Intravenous, Q1H PRN, Clydie Braun, MD   multivitamin with minerals tablet 1 tablet, 1 tablet, Oral, Daily, Smith, Rondell A, MD, 1 tablet at 12/15/21 1124   ondansetron (ZOFRAN) tablet 4 mg, 4 mg, Oral, Q6H PRN **OR** ondansetron (ZOFRAN) injection 4 mg, 4 mg, Intravenous, Q6H PRN, Katrinka Blazing, Rondell A, MD   propranolol (INDERAL) tablet 20 mg, 20 mg, Oral, Daily PRN, Smith, Rondell A, MD   sodium chloride flush (NS) 0.9 % injection 3 mL,  3 mL, Intravenous, Q12H, Smith, Rondell A, MD, 3 mL at 12/15/21 1131   thiamine (VITAMIN B1) tablet 100 mg, 100 mg, Oral, Daily, 100 mg at 12/15/21 1133 **OR** thiamine (VITAMIN B1) injection 100 mg, 100 mg, Intravenous, Daily, Smith, Rondell A, MD   venlafaxine XR (EFFEXOR-XR) 24 hr capsule 225 mg, 225 mg, Oral, Daily, Smith, Rondell A, MD   ziprasidone (GEODON) injection 20 mg, 20 mg, Intramuscular, Once PRN, Kommor, Madison, MD  Current Outpatient Medications:    amphetamine-dextroamphetamine (ADDERALL) 10 MG tablet, Take 1 tablet (10 mg total) by mouth 2 (two) times daily with a meal. (Patient taking differently: Take 10 mg by mouth daily as needed (focus for work).), Disp: 60 tablet, Rfl: 0   propranolol  (INDERAL) 20 MG tablet, Take 1 tablet (20 mg total) by mouth 2 (two) times daily as needed (anxiety). (Patient taking differently: Take 20 mg by mouth daily as needed (anxiety).), Disp: 180 tablet, Rfl: 0   venlafaxine XR (EFFEXOR XR) 75 MG 24 hr capsule, Take 3 capsules (225 mg total) by mouth daily., Disp: 270 capsule, Rfl: 0   doxylamine, Sleep, (UNISOM) 25 MG tablet, Take 1 tablet (25 mg total) by mouth at bedtime as needed. (Patient not taking: Reported on 12/15/2021), Disp: 30 tablet, Rfl: 0  Allergies: Allergies  Allergen Reactions   Shrimp (Diagnostic) Anaphylaxis and Itching   Watermelon [Citrullus Vulgaris] Itching and Swelling   Sulfamethoxazole-Trimethoprim Rash       Objective  Vital signs:  Temp:  [98.5 F (36.9 C)-98.6 F (37 C)] 98.6 F (37 C) (08/28 0845) Pulse Rate:  [80-111] 95 (08/28 1121) Resp:  [16] 16 (08/28 0845) BP: (108-133)/(58-85) 108/58 (08/28 1121) SpO2:  [97 %-98 %] 98 % (08/28 0845) Weight:  [84.1 kg] 84.1 kg (08/28 0318)  Psychiatric Specialty Exam:  Presentation  General Appearance: Appropriate for Environment; Fairly Groomed Eye Contact:Good Speech:Clear and Coherent Speech Volume:Normal Handedness:Right  Mood and Affect  Mood:Depressed Affect:Appropriate; Congruent; Depressed  Thought Process  Thought Processes:Coherent Descriptions of Associations:Intact  Orientation:Full (Time, Place and Person)  Thought Content:Abstract Reasoning  History of Schizophrenia/Schizoaffective disorder:No data recorded Duration of Psychotic Symptoms:No data recorded Hallucinations:Hallucinations: None  Ideas of Reference:None  Suicidal Thoughts:Suicidal Thoughts: No  Homicidal Thoughts:Homicidal Thoughts: No   Sensorium  Memory:Immediate Good; Recent Good; Remote Good Judgment:Fair Insight:Fair  Executive Functions  Concentration:Good Attention Span:Good Recall:Good Fund of Knowledge:Good Language:Good  Psychomotor Activity   Psychomotor Activity:Psychomotor Activity: Normal  Assets  Assets:Desire for Improvement; Financial Resources/Insurance; Housing; Vocational/Educational  Sleep  Sleep:Sleep: Fair   Physical Exam: Physical Exam Neurological:     General: No focal deficit present.     Mental Status: He is alert and oriented to person, place, and time.  Psychiatric:        Attention and Perception: Attention and perception normal.        Mood and Affect: Affect normal. Mood is depressed.        Speech: Speech normal.        Behavior: Behavior is cooperative.        Thought Content: Thought content normal.        Cognition and Memory: Cognition and memory normal.        Judgment: Judgment normal.   Review of Systems  Psychiatric/Behavioral:  Positive for depression and substance abuse. Negative for hallucinations, memory loss and suicidal ideas. The patient is nervous/anxious.    Blood pressure (!) 108/58, pulse 95, temperature 98.6 F (37 C), temperature source Oral, resp. rate 16, height  5\' 11"  (1.803 m), weight 84.1 kg, SpO2 98 %. Body mass index is 25.86 kg/m.

## 2021-12-15 NOTE — Consult Note (Signed)
ORTHOPAEDIC CONSULTATION HISTORY & PHYSICAL REQUESTING PHYSICIAN: Clydie Braun, MD  Chief Complaint: right UE multiple lacerations with LF/RF extension dysfunction  HPI: Ian Lamb is a 21 y.o. male who sustained multiple lacerations to the right upper extremity, apparently self-inflicted punching glass repeatedly.  He remains in the emergency department, disposition presently unclear to me.  Dr. Sherilyn Dacosta was on-call for hand surgery, and was consulted by the EDP regarding extensor dysfunction to the long and ring fingers.  He asked that I assume care for this patient.  The wounds have been cleansed and closed with dressings applied.  Past Medical History:  Diagnosis Date   Abscess 2011   Left calf   ADD (attention deficit disorder)    Anxiety    Depression    Past Surgical History:  Procedure Laterality Date   MOUTH SURGERY     in childhood   NO PAST SURGERIES     Social History   Socioeconomic History   Marital status: Single    Spouse name: Not on file   Number of children: 0   Years of education: 8   Highest education level: Not on file  Occupational History   Not on file  Tobacco Use   Smoking status: Every Day    Types: E-cigarettes   Smokeless tobacco: Former    Types: Chew    Quit date: 10/01/2020   Tobacco comments:    vaping daily  Vaping Use   Vaping Use: Every day  Substance and Sexual Activity   Alcohol use: Yes    Alcohol/week: 1.0 standard drink of alcohol    Types: 1 Shots of liquor per week    Comment: rarely   Drug use: Yes    Frequency: 7.0 times per week    Types: Marijuana   Sexual activity: Yes    Birth control/protection: Condom  Other Topics Concern   Not on file  Social History Narrative   Parents divorced.  Lives with mother and step dad. He gets along with his mom and step dad.  [employer] and will be starting 12 grade in Aug 2019      Father travels due to work and lives in [place], [place]. Pt see's him every 2 weeks.  Pt looks up to his biological dad a lot.      Pt is an only child but has a lot of cousins      Denies legal hx      Play track and lacrosse. He likes swimming.   Social Determinants of Health   Financial Resource Strain: Not on file  Food Insecurity: Not on file  Transportation Needs: Not on file  Physical Activity: Not on file  Stress: Not on file  Social Connections: Not on file   Family History  Problem Relation Age of Onset   Bipolar disorder Maternal Uncle    ADD / ADHD Neg Hx        Positive family history   Allergies  Allergen Reactions   Shrimp (Diagnostic) Anaphylaxis and Itching   Watermelon [Citrullus Vulgaris] Itching and Swelling   Sulfamethoxazole-Trimethoprim Rash   Prior to Admission medications   Medication Sig Start Date End Date Taking? Authorizing Provider  amphetamine-dextroamphetamine (ADDERALL) 10 MG tablet Take 1 tablet (10 mg total) by mouth 2 (two) times daily with a meal. Patient taking differently: Take 10 mg by mouth daily as needed (focus for work). 08/07/21 08/07/22 Yes Oletta Darter, MD  propranolol (INDERAL) 20 MG tablet Take 1 tablet (  20 mg total) by mouth 2 (two) times daily as needed (anxiety). Patient taking differently: Take 20 mg by mouth daily as needed (anxiety). 10/30/21  Yes Oletta Darter, MD  venlafaxine XR (EFFEXOR XR) 75 MG 24 hr capsule Take 3 capsules (225 mg total) by mouth daily. 10/30/21 10/30/22 Yes Oletta Darter, MD  doxylamine, Sleep, (UNISOM) 25 MG tablet Take 1 tablet (25 mg total) by mouth at bedtime as needed. Patient not taking: Reported on 12/15/2021 10/30/21   Oletta Darter, MD   DG Hand Complete Right  Result Date: 12/15/2021 CLINICAL DATA:  Laceration.  Punched hand through glass. EXAM: RIGHT HAND - COMPLETE 3+ VIEW COMPARISON:  None FINDINGS: Bandage material is identified overlying the second and third and fourth and fifth phalanges. Signs of soft tissue laceration noted overlying the ulnar side of the wrist  and fifth digit. Punctate radiodensity adjacent to the fifth PIP joint may represent small retained foreign body. No underlying acute osseous abnormality. No signs of fracture or dislocation. IMPRESSION: 1. No acute osseous abnormality. 2. Soft tissue laceration overlying the ulnar side of the wrist and fifth digit. 3. Punctate radiodensity adjacent to the fifth PIP joint may represent small retained foreign body. Electronically Signed   By: Signa Kell M.D.   On: 12/15/2021 05:21   DG Forearm Right  Result Date: 12/15/2021 CLINICAL DATA:  Punched through glass EXAM: RIGHT FOREARM - 2 VIEW COMPARISON:  None Available. FINDINGS: Swelling with bandage at the distal forearm. No opaque foreign body when accounting for external artifact. No acute fracture or dislocation. IMPRESSION: Negative for fracture or opaque foreign body. Electronically Signed   By: Tiburcio Pea M.D.   On: 12/15/2021 05:19    Positive ROS: All other systems have been reviewed and were otherwise negative with the exception of those mentioned in the HPI and as above.  Physical Exam: Vitals: Refer to EMR. Constitutional:  WD, WN, NAD HEENT:  NCAT, EOMI Neuro/Psych:  Alert & oriented to person, place, and time; appropriate mood & affect Lymphatic: No generalized extremity edema or lymphadenopathy Extremities / MSK:  The extremities are normal with respect to appearance, ranges of motion, joint stability, muscle strength/tone, sensation, & perfusion except as otherwise noted:  The dressings are opened enough to examine him.  The right ring finger has a wound with some dried blood at the PIP level dorsally.  It seems that he can actively extend the PIP against some degree of resistance, although perhaps not fully.  The significance is unclear and probably warrants further exploration.  There is also a deeper wound at the level of the distal forearm centrally dorsally, and it was through this wound that apparently tendon was observed  before closure.  He has no active extension of the long and ring fingers at the MP level, but he can extend the index and small fingers.  It is unclear whether that is through the EIP/EDQ alone or with also contribution of EDC.  Assessment: Right upper extremity multiple lacerations, with long and ring finger extensor dysfunction  Plan: Discussed these findings with the patient.  I indicated the most likely site of injury based on present examination is the laceration in the distal forearm.  I indicated that this would be explored, likely extended, and repair of tendons performed as indicated.  I will also probably better explore the wound on the ring finger dorsum at the PIP to ensure that there is no independent injury to the extensor apparatus at that level.  Goals,  risk, and options were reviewed and consent obtained.  Site was marked.  Ian Asters Janee Morn, MD      Orthopaedic & Hand Surgery [facility] Orthopaedic & Sports Medicine [facility] [address] [place], [place]  [address] Office: [phone] Mobile: [phone]  12/15/2021, 11:32 AM

## 2021-12-15 NOTE — ED Provider Notes (Signed)
..Laceration Repair  Date/Time: 12/15/2021 6:37 AM  Performed by: Arthor Captain, PA-C Authorized by: Arthor Captain, PA-C   Consent:    Consent obtained:  Verbal   Consent given by:  Patient   Risks discussed:  Infection, need for additional repair, pain, poor cosmetic result and poor wound healing   Alternatives discussed:  No treatment and delayed treatment Universal protocol:    Procedure explained and questions answered to patient or proxy's satisfaction: yes     Relevant documents present and verified: yes     Test results available: yes     Imaging studies available: yes     Required blood products, implants, devices, and special equipment available: yes     Site/side marked: yes     Immediately prior to procedure, a time out was called: yes     Patient identity confirmed:  Verbally with patient Anesthesia:    Anesthesia method:  Local infiltration   Local anesthetic:  Lidocaine 1% w/o epi Laceration details:    Location:  Hand   Hand location:  R hand, dorsum   Length (cm):  3 Pre-procedure details:    Preparation:  Patient was prepped and draped in usual sterile fashion Exploration:    Wound exploration: wound explored through full range of motion and entire depth of wound visualized   Treatment:    Area cleansed with:  Povidone-iodine   Amount of cleaning:  Standard   Irrigation solution:  Sterile saline   Irrigation method:  Syringe Skin repair:    Repair method:  Sutures   Suture size:  5-0   Suture material:  Prolene   Suture technique:  Running locked   Number of sutures:  3 Approximation:    Approximation:  Close Repair type:    Repair type:  Simple Post-procedure details:    Procedure completion:  Tolerated well, no immediate complications .Marland KitchenLaceration Repair  Date/Time: 12/15/2021 6:37 AM  Performed by: Arthor Captain, PA-C Authorized by: Arthor Captain, PA-C   Consent:    Consent obtained:  Verbal   Consent given by:  Patient   Risks  discussed:  Infection, need for additional repair, pain, poor cosmetic result and poor wound healing   Alternatives discussed:  No treatment and delayed treatment Universal protocol:    Procedure explained and questions answered to patient or proxy's satisfaction: yes     Relevant documents present and verified: yes     Test results available: yes     Imaging studies available: yes     Required blood products, implants, devices, and special equipment available: yes     Site/side marked: yes     Immediately prior to procedure, a time out was called: yes     Patient identity confirmed:  Verbally with patient Anesthesia:    Anesthesia method:  Local infiltration   Local anesthetic:  Lidocaine 1% w/o epi Laceration details:    Location:  Hand   Hand location:  R hand, dorsum   Length (cm):  2 Pre-procedure details:    Preparation:  Patient was prepped and draped in usual sterile fashion Exploration:    Wound exploration: wound explored through full range of motion and entire depth of wound visualized   Treatment:    Area cleansed with:  Povidone-iodine   Irrigation solution:  Sterile saline   Irrigation method:  Syringe Skin repair:    Repair method:  Sutures   Suture size:  5-0   Suture material:  Prolene   Suture technique:  Simple interrupted  Number of sutures:  2 Approximation:    Approximation:  Close Repair type:    Repair type:  Simple Post-procedure details:    Procedure completion:  Tolerated well, no immediate complications .Marland KitchenLaceration Repair  Date/Time: 12/15/2021 6:37 AM  Performed by: Arthor Captain, PA-C Authorized by: Arthor Captain, PA-C   Consent:    Consent obtained:  Verbal   Consent given by:  Patient   Risks discussed:  Infection, need for additional repair, pain, poor cosmetic result and poor wound healing   Alternatives discussed:  No treatment and delayed treatment Universal protocol:    Procedure explained and questions answered to patient or  proxy's satisfaction: yes     Relevant documents present and verified: yes     Test results available: yes     Imaging studies available: yes     Required blood products, implants, devices, and special equipment available: yes     Site/side marked: yes     Immediately prior to procedure, a time out was called: yes     Patient identity confirmed:  Verbally with patient Anesthesia:    Anesthesia method:  Local infiltration   Local anesthetic:  Lidocaine 1% w/o epi Laceration details:    Location:  Hand   Hand location:  L hand, dorsum   Length (cm):  1 Treatment:    Area cleansed with:  Povidone-iodine   Irrigation solution:  Sterile saline   Irrigation method:  Syringe Skin repair:    Repair method:  Sutures   Suture size:  5-0   Suture material:  Prolene   Suture technique:  Simple interrupted   Number of sutures:  1 Approximation:    Approximation:  Close Repair type:    Repair type:  Simple Post-procedure details:    Procedure completion:  Tolerated well, no immediate complications .Marland KitchenLaceration Repair  Date/Time: 12/15/2021 6:47 AM  Performed by: Arthor Captain, PA-C Authorized by: Arthor Captain, PA-C   Consent:    Consent obtained:  Verbal   Consent given by:  Patient   Risks discussed:  Infection, need for additional repair, pain, poor cosmetic result and poor wound healing   Alternatives discussed:  No treatment and delayed treatment Universal protocol:    Procedure explained and questions answered to patient or proxy's satisfaction: yes     Relevant documents present and verified: yes     Test results available: yes     Imaging studies available: yes     Required blood products, implants, devices, and special equipment available: yes     Site/side marked: yes     Immediately prior to procedure, a time out was called: yes     Patient identity confirmed:  Verbally with patient Anesthesia:    Anesthesia method:  Local infiltration   Local anesthetic:   Lidocaine 1% w/o epi Laceration details:    Location:  Hand   Hand location:  R hand, dorsum Pre-procedure details:    Preparation:  Patient was prepped and draped in usual sterile fashion Exploration:    Wound exploration: wound explored through full range of motion and entire depth of wound visualized   Treatment:    Area cleansed with:  Povidone-iodine   Irrigation solution:  Sterile saline   Irrigation method:  Syringe Skin repair:    Repair method:  Sutures   Suture size:  5-0   Suture material:  Prolene   Suture technique:  Running   Number of sutures:  3 Approximation:    Approximation:  Close Repair type:  Repair type:  Simple Post-procedure details:    Procedure completion:  Tolerated well, no immediate complications     Arthor Captain, PA-C 12/15/21 3300    Zadie Rhine, MD 12/15/21 [phone]
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
## 2021-12-15 NOTE — Progress Notes (Signed)
Orthopedic Tech Progress Note Patient Details:  Ian Lamb 2000/10/12 [id number]  I was asked to put on high volar to help with lacerations to hand  Ortho Devices Type of Ortho Device: Cotton web roll, Ace wrap, Volar splint Ortho Device/Splint Location: RUE Ortho Device/Splint Interventions: Ordered, Application, Adjustment   Post Interventions Patient Tolerated: Well Instructions Provided: Care of device  Donald Pore 12/15/2021, 8:10 AM
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
## 2021-12-16 ENCOUNTER — Encounter (HOSPITAL_COMMUNITY): Payer: Self-pay | Admitting: Orthopedic Surgery

## 2021-12-16 ENCOUNTER — Telehealth (HOSPITAL_COMMUNITY): Payer: Self-pay | Admitting: Licensed Clinical Social Worker

## 2021-12-16 ENCOUNTER — Observation Stay (HOSPITAL_COMMUNITY): Payer: Managed Care, Other (non HMO)

## 2021-12-16 DIAGNOSIS — F10929 Alcohol use, unspecified with intoxication, unspecified: Secondary | ICD-10-CM | POA: Diagnosis not present

## 2021-12-16 DIAGNOSIS — F32A Depression, unspecified: Secondary | ICD-10-CM | POA: Diagnosis not present

## 2021-12-16 DIAGNOSIS — F419 Anxiety disorder, unspecified: Secondary | ICD-10-CM | POA: Diagnosis not present

## 2021-12-16 DIAGNOSIS — F10920 Alcohol use, unspecified with intoxication, uncomplicated: Secondary | ICD-10-CM

## 2021-12-16 DIAGNOSIS — S61411A Laceration without foreign body of right hand, initial encounter: Secondary | ICD-10-CM | POA: Diagnosis not present

## 2021-12-16 DIAGNOSIS — Z046 Encounter for general psychiatric examination, requested by authority: Secondary | ICD-10-CM | POA: Diagnosis not present

## 2021-12-16 DIAGNOSIS — S51811A Laceration without foreign body of right forearm, initial encounter: Secondary | ICD-10-CM | POA: Diagnosis not present

## 2021-12-16 LAB — MAGNESIUM: Magnesium: 1.9 mg/dL (ref 1.7–2.4)

## 2021-12-16 LAB — PHOSPHORUS: Phosphorus: 3.4 mg/dL (ref 2.5–4.6)

## 2021-12-16 MED ORDER — HYDROCODONE-ACETAMINOPHEN 5-325 MG PO TABS: 1.0000 | ORAL_TABLET | Freq: Three times a day (TID) | ORAL | 0 refills | Status: DC | PRN

## 2021-12-16 MED ORDER — ORAL CARE MOUTH RINSE: 15.0000 mL | OROMUCOSAL | Status: DC | PRN

## 2021-12-16 MED ORDER — NALTREXONE HCL 50 MG PO TABS: 25.0000 mg | ORAL_TABLET | Freq: Every day | ORAL | 0 refills | Status: DC

## 2021-12-16 NOTE — Anesthesia Postprocedure Evaluation (Signed)
Anesthesia Post Note  Patient: Ian Lamb  Procedure(s) Performed: RIGHT HAND TENDON REPAIR (Right)     Patient location during evaluation: PACU Anesthesia Type: General Level of consciousness: awake and alert Pain management: pain level controlled Vital Signs Assessment: post-procedure vital signs reviewed and stable Respiratory status: spontaneous breathing, nonlabored ventilation, respiratory function stable and patient connected to nasal cannula oxygen Cardiovascular status: blood pressure returned to baseline and stable Postop Assessment: no apparent nausea or vomiting Anesthetic complications: no   No notable events documented.                Shelton Silvas

## 2021-12-16 NOTE — Telephone Encounter (Signed)
The therapist calls Ian Lamb's mother having been asked to reach out to her by Ms. Harvel Quale, LPN.   The therapist explains the [facility]  and the hours.   His mother says that he will be in the hospital recovering from his surgery for a few days and that she will share this therapist's name and contact information and have him call at some point.   She notes that he drinks and uses marijuana and may not be agreeable to abstinence-based treatment but that she will provide an update should he not call this therapist.  Myrna Blazer, MA, LCSW, [facility], LCAS 12/16/2021

## 2021-12-16 NOTE — Plan of Care (Signed)
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
## 2021-12-16 NOTE — Consult Note (Signed)
[facility] Psychiatry Follow-Up Face-to-Face Psychiatric Evaluation  Date of Service: December 16, 2021 Name: Ian Lamb DOB: 26-Dec-2000 MRN: [id number] Reason for Consult: " IVC, SI, aggressive behavior " Requesting Provider: Glendora Score MD  Assessment  Ian Lamb IV is a 21 y.o. male admitted medically for 12/15/2021  3:14 AM for Laceration of forearm [S51.819A] Laceration of right hand, foreign body presence unspecified, initial encounter [S61.411A]. He carries the psychiatric diagnoses of Anxiety, depression, Insomnia, ADHD, and binge drinking and has a past medical history of Abscess (2011), ADD (attention deficit disorder), Anxiety, Depression, and Family history of adverse reaction to anesthesia.     His current presentation of ongoing alcohol abuse despite several attempts to stop in the setting of significiant social costs is consistent with an alcohol use disorder. He also meets criteria for MDD which by all accounts predated the EtOH use disorder. Current outpatient psychotropic medications include propranalol, unisom, and venlafaxine; he is mostly compliant with venlafaxine and takes the others as needed. Pt and family report a fair response to these medications; his mood does seem to be improved but he continues to drink to excess, which leads to episodes of agitation. On initial examination, patient is alert and oriented X3 and expresses and interest in maintaining sobriety and undergoing rehab. Please see plan below for detailed recommendations.     Please see plan below for detailed recommendations.   Diagnoses:  Active Hospital problems: Principal Problem:   Laceration of forearm Active Problems:   Anxiety and depression   Involuntary commitment   Alcohol intoxication (HCC)  Plan  ## Safety and Observation Level:  - Based on my clinical evaluation, I estimate the patient to be at moderate risk of self harm in the current setting - At this time, we  recommend a routine level of observation. This decision is based on my review of the chart including patient's history and current presentation, interview of the patient, mental status examination, and consideration of suicide risk including evaluating suicidal ideation, plan, intent, suicidal or self-harm behaviors, risk factors, and protective factors. This judgment is based on our ability to directly address suicide risk, implement suicide prevention strategies and develop a safety plan while the patient is in the clinical setting. Please contact our team if there is a concern that risk level has changed.  ## Medications:  -- Plan to start Naltrexone 50mg  PO qH 24h AFTER last use of opioid pain medication. enoxaparin (LOVENOX) injection, 40 mg, Q24H folic acid, 1 mg, Daily lidocaine (PF), 30 mL, Once lidocaine-EPINEPHrine, 10 mL, Once LORazepam, 0-4 mg, Q6H  Followed by ON 12/17/2021] LORazepam, 0-4 mg, Q12H multivitamin with minerals, 1 tablet, Daily sodium chloride flush, 3 mL, Q12H thiamine, 100 mg, Daily  Or thiamine, 100 mg, Daily venlafaxine XR, 225 mg, Daily   sodium chloride, Last Rate: 75 mL/hr at 12/15/21 1130   ## Medical Decision Making Capacity:  Formal decision making capacity was not assessed for any particular decision as part of routine psychiatric evaluation    ## Further Work-up:  -- Per primary team, he is safe to discharge from a medical standpoint.   -- most recent EKG on 05/27/21 had QtC of 392 -- Antipsychotic monitoring: A1c No results found for requested labs within last 1095 days. (No results found for requested labs within last 1095 days.), LDL No results found for requested labs within last 1095 days. (No results found for requested labs within last 1095 days.) -- TSH 1.86 (05/26/2021)  --  Pertinent labwork reviewed earlier this admission includes: ETOH-209 UDS positive for [facility]   ## Disposition:  -- There are no current psychiatric  contraindications to discharge at this time -- Per primary team -- Patient and family would like CDIOP (SIOP) or Partial Hospitalization for rehab and mental health concerns. We called Felicity Pellegrini, who should be in contact with the patient. We put additional resources and options in the discharge summary. -- Family and patient would also like resources on hotlines or other means of 3rd party de-escalation in times of stress.   ## Behavioral / Environmental:  - Under IVC, will be removed.  ##Legal Status - IVC in place, we recommend removing the IVC at this time  Thank you for this consult request. Recommendations have been communicated to the primary team. We will sign off at this time; please do not hesitate to reconsult psychiatry if any additional questions or concerns arise.   Missy Sabins, Medical Student  History  Relevant Aspects of Hospital Course:  Admitted on 12/15/2021 for Laceration of forearm [S51.819A] Laceration of right hand, foreign body presence unspecified, initial encounter [S61.411A].  Patient Report:  Mom at bedside.   Patient was initially seen laying in bed and remained that way throughout interview.   Today, patient is feeling well. He is future and goal oriented and looking froward to rehab and feeling better. Surgery went well, and he is pleased with the lack of pain.  Safety planning was done with mom. Both are confident they can be safe with the given resources. We discussed reasons to present to the hospital and the new prescription for naltrexone at length; timing of initiation and r/b/se discussed extensively. Helped to mediate some discord between pt and mother, which is not unexpected given reported family dynamics in the setting of alcohol addiction - both pt and mother responded well to this intervention.   ROS:  Mood- "Pretty Good" Sleep- "Good" Pt reports 6.5h of sleep. Appetite- "Good, I had [employer] this morning and that was  good."    Collateral information: Mom was present in the room and collaborated all information stated by patient.   Psychiatric History:  Information collected from chart review and patient   Family psych history: Denied suicide, bipolar d/o, SCZ/SCzA   Social History:  Lives with mom, bounces between mom and dad Works for [employer]   Tobacco use: None Alcohol use: Binge drinking.  No hx EtOH w/d seizures or other signs of withdrawal.  Drug use: Smokes weed, doesn't like edibles (too strong, "the crack of weed"). Uses delta-8, delta-9; weed use significantly increases when drinking liquor (Dr. Joycelyn Schmid Cinderella)  Family History:  The patient's family history includes Bipolar disorder in his maternal uncle.  Medical History: Past Medical History:  Diagnosis Date   Abscess 2011   Left calf   ADD (attention deficit disorder)    Anxiety    Depression    Family history of adverse reaction to anesthesia     Surgical History: Past Surgical History:  Procedure Laterality Date   MOUTH SURGERY     in childhood   NO PAST SURGERIES     TYMPANOSTOMY TUBE PLACEMENT Bilateral     Medications:   Current Facility-Administered Medications:    0.9 %  sodium chloride infusion, , Intravenous, Continuous, Milly Jakob, MD, Last Rate: 75 mL/hr at 12/15/21 1130, New Bag at 12/15/21 1130   acetaminophen (TYLENOL) tablet 650 mg, 650 mg, Oral, Q6H PRN **OR** acetaminophen (TYLENOL) suppository 650 mg, 650  mg, Rectal, Q6H PRN, Mack Hook, MD   albuterol (PROVENTIL) (2.5 MG/3ML) 0.083% nebulizer solution 2.5 mg, 2.5 mg, Nebulization, Q6H PRN, Mack Hook, MD   enoxaparin (LOVENOX) injection 40 mg, 40 mg, Subcutaneous, Q24H, Mack Hook, MD, 40 mg at 12/16/21 8588   folic acid (FOLVITE) tablet 1 mg, 1 mg, Oral, Daily, Mack Hook, MD, 1 mg at 12/16/21 0948   HYDROcodone-acetaminophen (NORCO/VICODIN) 5-325 MG per tablet 1-2 tablet, 1-2 tablet, Oral, Q4H PRN,  Mack Hook, MD, 2 tablet at 12/16/21 1032   lidocaine (PF) (XYLOCAINE) 1 % injection 30 mL, 30 mL, Other, Once, Mack Hook, MD   lidocaine-EPINEPHrine (XYLOCAINE W/EPI) 2 %-1:200000 (PF) injection 10 mL, 10 mL, Intradermal, Once, Mack Hook, MD   LORazepam (ATIVAN) injection 0-4 mg, 0-4 mg, Intravenous, Q6H **FOLLOWED BY** [START ON 12/17/2021] LORazepam (ATIVAN) injection 0-4 mg, 0-4 mg, Intravenous, Q12H, Mack Hook, MD   LORazepam (ATIVAN) tablet 1-4 mg, 1-4 mg, Oral, Q1H PRN **OR** LORazepam (ATIVAN) injection 1-4 mg, 1-4 mg, Intravenous, Q1H PRN, Mack Hook, MD   multivitamin with minerals tablet 1 tablet, 1 tablet, Oral, Daily, Mack Hook, MD, 1 tablet at 12/16/21 0948   ondansetron (ZOFRAN) tablet 4 mg, 4 mg, Oral, Q6H PRN **OR** ondansetron (ZOFRAN) injection 4 mg, 4 mg, Intravenous, Q6H PRN, Mack Hook, MD   Oral care mouth rinse, 15 mL, Mouth Rinse, PRN, Katrinka Blazing, Rondell A, MD   propranolol (INDERAL) tablet 20 mg, 20 mg, Oral, Daily PRN, Mack Hook, MD   sodium chloride flush (NS) 0.9 % injection 3 mL, 3 mL, Intravenous, Q12H, Mack Hook, MD, 3 mL at 12/15/21 2235   thiamine (VITAMIN B1) tablet 100 mg, 100 mg, Oral, Daily, 100 mg at 12/16/21 0948 **OR** thiamine (VITAMIN B1) injection 100 mg, 100 mg, Intravenous, Daily, Mack Hook, MD   venlafaxine XR [facility]) 24 hr capsule 225 mg, 225 mg, Oral, Daily, Mack Hook, MD, 225 mg at 12/16/21 0948   ziprasidone (GEODON) injection 20 mg, 20 mg, Intramuscular, Once PRN, Mack Hook, MD  Allergies: Allergies  Allergen Reactions   Shrimp (Diagnostic) Anaphylaxis and Itching   Watermelon [Citrullus Vulgaris] Itching and Swelling   Sulfamethoxazole-Trimethoprim Rash     Objective  Vital signs:  BMI Body mass index is 25.86 kg/m. Temp:  [97.7 F (36.5 C)-98.7 F (37.1 C)] 98.7 F (37.1 C) (08/29 0830) Pulse Rate:  [54-106] 84 (08/29 0830) Resp:  [14-21] 14 (08/29 0830) BP:  (110-131)/(60-85) 116/70 (08/29 0830) SpO2:  [94 %-100 %] 99 % (08/29 0830)  Psychiatric Specialty Exam: General Appearance: Appropriate for Environment; Well Groomed   Eye Contact: Good   Speech: Clear and Coherent   Volume: Normal    Mood: Labile; Anxious; Depressed  Affect: Appropriate; Labile    Thought Process: Coherent  Descriptions of Associations: Intact  Duration of Psychotic Symptoms:No data recorded  Past Diagnosis of Schizophrenia or Psychoactive disorder:No data recorded   Orientation: Full (Time, Place and Person)   Thought Content: Abstract Reasoning  Hallucinations: None  No data recorded No data recorded  Ideas of Reference: None   Suicidal Thoughts: No  No data recorded No data recorded  Homicidal Thoughts: No  No data recorded No data recorded   Memory: Immediate Good; Recent Good; Remote Good    Judgement: Poor  Insight: Fair    Psychomotor Activity: Normal    Concentration: Good  Attention Span: Good  Recall: Good    Fund of Knowledge: Good    Language: Good    Handed: Right  Assets: Desire for Improvement; Vocational/Educational; Housing; Financial Resources/Insurance    Sleep: Good      CIWA:CIWA-Ar Total: 1 COWS:    Physical Exam: Physical Exam HENT:     Head: Normocephalic and atraumatic.  Neurological:     Mental Status: He is alert.  Psychiatric:        Attention and Perception: Attention and perception normal.        Mood and Affect: Mood and affect normal.        Speech: Speech normal.        Behavior: Behavior normal. Behavior is cooperative.        Thought Content: Thought content normal.        Cognition and Memory: Cognition and memory normal.        Judgment: Judgment normal.    Review of Systems  Psychiatric/Behavioral:  Positive for depression and substance abuse (Alcohol, THC). Negative for hallucinations, memory loss and suicidal ideas. The patient is nervous/anxious. The  patient does not have insomnia.     Signed: Missy Sabins, Medical Student Psychiatry Consult Service [facility] 12/16/2021, 11:57 AM

## 2021-12-16 NOTE — Progress Notes (Signed)
Commitment change form signed by Dr Gasper Sells and faxed to [facility].  Original placed on chart. Daleen Squibb, MSW, LCSW 8/29/20231:19 PM

## 2021-12-16 NOTE — Progress Notes (Signed)
IV removed. No equipment needed. Discharge instructions discussed with patient and mom. Adequate for discharge.

## 2021-12-17 ENCOUNTER — Telehealth (HOSPITAL_COMMUNITY): Payer: Self-pay | Admitting: Licensed Clinical Social Worker

## 2021-12-17 NOTE — Telephone Encounter (Signed)
The therapist return's Ian Lamb's mother's call. She says that she would like to get him in for an assessment today; however, he is sleeping and recovering from surgery so may not want to come.   Thus, the therapist notes that he could either come in today at 3:30 p.m. or on 12/24/21 at 1 p.m. His mother says that she will call back later to confirm once he has woken up.  She notes that he has ADHD and an auditory processing deficit so can get nervous when asked a bunch of questions.   Myrna Blazer, MA, LCSW, [facility], LCAS 12/17/2021

## 2021-12-17 NOTE — Telephone Encounter (Signed)
The therapist receives a voicemail from [place] mother saying that he cannot come in today but that she wants to schedule for him to attend an assessment on 12/24/21 at 1 p.m. EST.  Myrna Blazer, MA, LCSW, [facility], LCAS 12/17/2021

## 2021-12-18 ENCOUNTER — Telehealth (HOSPITAL_BASED_OUTPATIENT_CLINIC_OR_DEPARTMENT_OTHER): Payer: 59 | Admitting: Psychiatry

## 2021-12-18 ENCOUNTER — Encounter (HOSPITAL_COMMUNITY): Payer: Self-pay | Admitting: Psychiatry

## 2021-12-18 DIAGNOSIS — F1020 Alcohol dependence, uncomplicated: Secondary | ICD-10-CM

## 2021-12-18 DIAGNOSIS — F324 Major depressive disorder, single episode, in partial remission: Secondary | ICD-10-CM | POA: Diagnosis not present

## 2021-12-18 DIAGNOSIS — F17200 Nicotine dependence, unspecified, uncomplicated: Secondary | ICD-10-CM | POA: Diagnosis not present

## 2021-12-18 DIAGNOSIS — F411 Generalized anxiety disorder: Secondary | ICD-10-CM

## 2021-12-18 DIAGNOSIS — F1210 Cannabis abuse, uncomplicated: Secondary | ICD-10-CM

## 2021-12-18 MED ORDER — DISULFIRAM 250 MG PO TABS: 250.0000 mg | ORAL_TABLET | Freq: Every day | ORAL | 0 refills | Status: DC

## 2021-12-18 MED ORDER — DIVALPROEX SODIUM ER 250 MG PO TB24: 250.0000 mg | ORAL_TABLET | Freq: Every day | ORAL | 0 refills | Status: DC
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
## 2021-12-24 ENCOUNTER — Telehealth (HOSPITAL_COMMUNITY): Payer: Self-pay | Admitting: Licensed Clinical Social Worker

## 2021-12-24 ENCOUNTER — Ambulatory Visit (INDEPENDENT_AMBULATORY_CARE_PROVIDER_SITE_OTHER): Payer: 59 | Admitting: Licensed Clinical Social Worker

## 2021-12-24 ENCOUNTER — Encounter (HOSPITAL_COMMUNITY): Payer: Self-pay

## 2021-12-24 DIAGNOSIS — F1020 Alcohol dependence, uncomplicated: Secondary | ICD-10-CM

## 2021-12-24 DIAGNOSIS — F1220 Cannabis dependence, uncomplicated: Secondary | ICD-10-CM

## 2021-12-24 DIAGNOSIS — F900 Attention-deficit hyperactivity disorder, predominantly inattentive type: Secondary | ICD-10-CM

## 2021-12-24 DIAGNOSIS — F17200 Nicotine dependence, unspecified, uncomplicated: Secondary | ICD-10-CM

## 2021-12-24 NOTE — Telephone Encounter (Signed)
The therapist receives the following email from [place] mother on 12/23/21:  "Hi Mr Wonda Cerise,  Wanted to check in before [name]'s appt tmw. [name] has not had any alcohol since Aug 28. His arm checked out today with his orthopedic Dr & he is on no pain pills, IBU or Tylenol.   [name] is currently taking  naltrexone started Sept 2 in am: new medication: Dr Gasper Sells venalaxine every day propanol as needed depakote started taking Sept 2 in pm:  new medication: Dr Michae Kava I was not a part of the Dr Michae Kava AUG 31 phone call appt (previously scheduled/reg check in) The notes from that appt are still not on MYChart.   No major side effects to either new RX. [name] said he had headaches & tired but could be related to past week as well.   [name] has been wanting some additional help because he has been frustrated on not feeling any better about things/himself  despite showing up to his counselor (Dr Thedore Mins) & Dr Michae Kava appts + taking meds for years. We are concerned because he has so much pain & feels so bad about himself/no self esteem. A lot if this shows as anxiety, anger & depression.  [name]'s drinking & drug use did not start until high school. Currently when he drinks, it can be binge drinking, but not always. He does not drink every day. He does smoke pot frequently & that is the only drug aware of.  We are concerned about his drinking & marijuana use but were wanting him to get more counseling in regard to his moods: anxiety, anger & depression. We did not think he needed rehab for drugs/alcohol -  We are open to whatever sort of treatment is determined [name] needs. Aware [name] is 26 & these choices are up to him.   Thank you,  Sandi Carne[address] MA, LCSW, [facility], LCAS 12/24/2021

## 2021-12-24 NOTE — Plan of Care (Signed)
Ian Lamb verbally agrees to the following Care Plan:  Problem:  Dual Disorders Goal:  Ian Lamb will abstain from alcohol 30/30 days out of a month per Ian Lamb and mom's report and per random breathalyzer as needed.  Outcome: Not Applicable Goal: Ian Lamb will report a reduction in his symptoms of depression and anxiety as evidenced by having a PHQ-9 and GAD-7 of 4 or less.  Outcome: Not Applicable
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
## 2021-12-30 ENCOUNTER — Encounter (HOSPITAL_COMMUNITY): Payer: Self-pay

## 2021-12-30 ENCOUNTER — Ambulatory Visit (INDEPENDENT_AMBULATORY_CARE_PROVIDER_SITE_OTHER): Payer: 59 | Admitting: Licensed Clinical Social Worker

## 2021-12-30 DIAGNOSIS — F900 Attention-deficit hyperactivity disorder, predominantly inattentive type: Secondary | ICD-10-CM

## 2021-12-30 DIAGNOSIS — F17200 Nicotine dependence, unspecified, uncomplicated: Secondary | ICD-10-CM

## 2021-12-30 DIAGNOSIS — F1020 Alcohol dependence, uncomplicated: Secondary | ICD-10-CM | POA: Diagnosis not present

## 2021-12-30 DIAGNOSIS — F1220 Cannabis dependence, uncomplicated: Secondary | ICD-10-CM

## 2021-12-30 NOTE — Plan of Care (Signed)
Ian Lamb verbally agrees to the following Care Plan:  Problem:  Dual Diagnosis Goal:  Ian Lamb will abstain from alcohol 30/30 days out of a month per Ian Lamb and mom's report and per random breathalyzer as needed. Outcome: Initial Goal: Ian Lamb will report a reduction in his symptoms of depression and anxiety as evidenced by having a PHQ-9 and GAD-7 of 4 or less. Outcome: Initial Intervention: Therapist will assist Ian Lamb in identifying the triggers for using drugs and alcohol and how to avoid them while understanding how continuing to use marijuana will negatively impact his efforts at not drinking.  Note: Discussed with Ian Lamb. Intervention: Therapist will work with Ian Lamb on building a non-using support system while adhering to rules at his parents' house while either returning to school or maintaining regular employment per Ian Lamb and mom's report. Note: Discussed with Ian Lamb.
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
## 2022-01-01 ENCOUNTER — Telehealth (HOSPITAL_BASED_OUTPATIENT_CLINIC_OR_DEPARTMENT_OTHER): Payer: 59 | Admitting: Psychiatry

## 2022-01-01 DIAGNOSIS — F324 Major depressive disorder, single episode, in partial remission: Secondary | ICD-10-CM

## 2022-01-01 DIAGNOSIS — F1020 Alcohol dependence, uncomplicated: Secondary | ICD-10-CM

## 2022-01-01 DIAGNOSIS — F1220 Cannabis dependence, uncomplicated: Secondary | ICD-10-CM | POA: Diagnosis not present

## 2022-01-01 DIAGNOSIS — F411 Generalized anxiety disorder: Secondary | ICD-10-CM

## 2022-01-01 DIAGNOSIS — F5105 Insomnia due to other mental disorder: Secondary | ICD-10-CM

## 2022-01-01 MED ORDER — DIVALPROEX SODIUM ER 250 MG PO TB24: 250.0000 mg | ORAL_TABLET | Freq: Every day | ORAL | 0 refills | Status: DC

## 2022-01-01 MED ORDER — NALTREXONE HCL 50 MG PO TABS: 50.0000 mg | ORAL_TABLET | Freq: Every day | ORAL | 0 refills | Status: DC

## 2022-01-01 NOTE — Progress Notes (Signed)
Virtual Visit via Video Note  I connected with Ian Lamb on 01/01/22 at 10:30 AM EDT by  a video enabled telemedicine application and verified that I am speaking with the correct person using two identifiers.  Location: Patient: Home Provider: office   I discussed the limitations of evaluation and management by telemedicine and the availability of in person appointments. The patient expressed understanding and agreed to proceed.  History of Present Illness: Ian Lamb has started substance abuse therapy and likes it. He has not had any alcohol in 2 weeks and denies any withdrawal. He does not ever want to drink ever again or maybe in the spring. Ian Lamb admits he does think about drinking but denies any cravings. He is still taking Naltrexone but is not taking Antabuse. He does not want to take it. Ian Lamb is not working and doesn't want to spend money right now. He goes back to work in about 2 months but doesn't have the exact date yet. His hand is healing and he is not in as much as poin. Ian Lamb is taking Depakote and has only taken about 3 doses so far. He is denying any SE. His mom notices that he seems "more spacy" when he misses a dose of Depakote. He does not see it. Ian Lamb has been spending a lot time with a [name] who does not drink. He gets very irritated when he is being told to do things that are conflicting. He is getting better about stepping away before he gets angry at anyone. His anger is a little better. He has not been violent or yelled at anyone. Ian Lamb is no longer feeling depressed daily. It is mostly situational lately when he thinks about his grandpa. He denies SI/HI. Yesterday he was feeling sad and decided to spend the day sleeping. He slept until 6pm but felt better after that. He went back to sleep after 11pm. Anxiety is up when he thinks about going to [place] and seeing his cousins. There was some upset between them and he is nervous to see them again. Ian Lamb is not really having anxiety  outside of that. The Propranolol helps. He still smokes marijuana when he can't sleep at night. This past 2 weeks he has used it 20 times. This has slowed down a lot from before.    Observations/Objective: Psychiatric Specialty Exam: ROS  There were no vitals taken for this visit.There is no height or weight on file to calculate BMI.  General Appearance: Casual  Eye Contact:  Fair  Speech:  Clear and Coherent and Slow  Volume:  Normal  Mood:  Anxious and Depressed  Affect:  Blunt  Thought Process:  Goal Directed, Linear, and Descriptions of Associations: Intact  Orientation:  Full (Time, Place, and Person)  Thought Content:  Logical  Suicidal Thoughts:  No  Homicidal Thoughts:  No  Memory:  Immediate;   Good  Judgement:  Fair  Insight:  Fair  Psychomotor Activity:  Normal  Concentration:  Concentration: Good  Recall:  Good  Fund of Knowledge:  Good  Language:  Good  Akathisia:  No  Handed:  Right  AIMS (if indicated):     Assets:  Communication Skills Desire for Improvement Financial Resources/Insurance Housing Leisure Time Physical Health Resilience Social Support Talents/Skills Transportation Vocational/Educational  ADL's:  Intact  Cognition:  WNL  Sleep:        Assessment and Plan:     10/30/2021    2:07 PM 09/11/2021    8:49 AM 08/07/2021  1:44 PM 05/01/2021    2:46 PM 05/01/2021    2:42 PM  Depression screen PHQ 2/9  Decreased Interest 1 2 1  0 0  Down, Depressed, Hopeless 3 2 1 3 1   PHQ - 2 Score 4 4 2 3 1   Altered sleeping 3 2 1  0   Tired, decreased energy 1 2 1  0   Change in appetite 0  0 0   Feeling bad or failure about yourself  3  2 3    Trouble concentrating 3  3 1    Moving slowly or fidgety/restless 0  0 0   Suicidal thoughts 1  0    PHQ-9 Score 15 8 9 7    Difficult doing work/chores Very difficult  Very difficult Somewhat difficult     Flowsheet Row ED to Hosp-Admission (Discharged) from 12/15/2021 in [facility]TH[facility] Video Visit from 10/30/2021 [facility] Video Visit from 08[facility]07/2021 in [facility]  C-SSRS RISK CATEGORY No Risk Error: Q3, 4, or 5 should not be populated when Q2 is No No Risk          Status of current problems: no alcohol use in 2 weeks- in therapy, ongoing anxiety and depression  Meds: He is tolerating the Depakote and wants to continue it.  1. Alcohol use disorder, severe, dependence (HCC)  2. Major depressive disorder with single episode, in partial remission (HCC) - divalproex (DEPAKOTE ER) 250 MG 24 hr tablet; Take 1 tablet (250 mg total) by mouth daily.  Dispense: 90 tablet; Refill: 0  3. GAD (generalized anxiety disorder)  4. Cannabis use disorder, severe, dependence (HCC)  5. Insomnia due to other mental disorder  No refill on Adderall as he is not taking it  Continue: Propranolol 20mg  po BID prn anxiety  Effexor XR 225mg  po qD   Labs: none    Therapy: brief supportive therapy provided. Discussed psychosocial stressors in detail.    Recommended pt stop all drug and alcohol use. He continues to smoke marijuana on a near daily basis.    Collaboration of Care: Referral or follow-up with counselor/therapist AEB therapy  Patient/Guardian was advised Release of Information must be obtained prior to any record release in order to collaborate their care with an outside provider. Patient/Guardian was advised if they have not already done so to contact the registration department to sign all necessary forms in order for to release information regarding their care.   Consent: Patient/Guardian gives verbal consent for treatment and assignment of benefits for services provided during this visit. Patient/Guardian expressed understanding and agreed to proceed.       Follow Up Instructions: Follow up in 4-6 weeks or sooner if needed    I discussed the assessment and treatment  plan with the patient. The patient was provided an opportunity to ask questions and all were answered. The patient agreed with the plan and demonstrated an understanding of the instructions.   The patient was advised to call back or seek an in-person evaluation if the symptoms worsen or if the condition fails to improve as anticipated.  I provided 20 minutes of non-face-to-face time during this encounter.   , MD

## 2022-01-13 ENCOUNTER — Ambulatory Visit (HOSPITAL_COMMUNITY): Payer: 59 | Admitting: Licensed Clinical Social Worker

## 2022-01-13 DIAGNOSIS — F1020 Alcohol dependence, uncomplicated: Secondary | ICD-10-CM

## 2022-01-13 DIAGNOSIS — F1220 Cannabis dependence, uncomplicated: Secondary | ICD-10-CM

## 2022-01-13 DIAGNOSIS — F411 Generalized anxiety disorder: Secondary | ICD-10-CM

## 2022-01-13 DIAGNOSIS — F324 Major depressive disorder, single episode, in partial remission: Secondary | ICD-10-CM

## 2022-01-13 NOTE — Progress Notes (Signed)
THERAPIST PROGRESS NOTE  Session Time: 1 p.m. to 2 p.m.   Type of Therapy: Individual   Therapist Response/Interventions: Motivational Interviewing/The therapist educates Ian Lamb about what Alcoholics Anonymous is and questions Ian Lamb concerning why most alcoholics have the goal of total sobriety versus trying to learn to drink in a controlled fashion.   The therapist asks if Ian Lamb still has the goal of quitting marijuana. The therapist asks how most people who wake up early manage to get back to sleep without smoking pot.   Treatment Goals addressed: Ian Lamb will abstain from alcohol 30/30 days out of a month per Ian Lamb and mom's report and per random breathalyzer as needed Ian Lamb will report a reduction in his symptoms of depression and anxiety as evidenced by having a PHQ-9 and GAD-7 of 4 or less. (Update due 06/30/2022)  Summary: Ian Lamb presents saying that he still has not drunk and did not want to drink until this past Saturday wanting to have two or three drinks when watching football. He did not drink but has been replacing it with energy drinks.   He is smoking pot two or three times per week mainly in the mornings when he wakes up early. He smokes pot to allow him to be able to go back to sleep.   Ian Lamb says that he wants to eventually wants to get back to drinking but with six being the "max." Ian Lamb admits that he is considering "testing" that he can drink in a controlled fashion maybe seeing if he can drink four drinks and stop this weekend.   Progress Towards Goals: Some progress  Suicidal/Homicidal: No SI or HI  Plan: Return again in 2 weeks.  Diagnosis: Alcohol Use Disorder, Severe; Cannabis Use Disorder, Severe; Nicotine Dependence; and ADHD  Collaboration of Care: Other N/A  Patient/Guardian was advised Release of Information must be obtained prior to any record release in order to collaborate their care with an outside provider. Patient/Guardian was advised if they have not already done  so to contact the registration department to sign all necessary forms in order for [place] to release information regarding their care.   Consent: Patient/Guardian gives verbal consent for treatment and assignment of benefits for services provided during this visit. Patient/Guardian expressed understanding and agreed to proceed.   Adam Phenix, [place], LCSW, [facility], [place] 01/13/2022

## 2022-01-15 ENCOUNTER — Other Ambulatory Visit (HOSPITAL_COMMUNITY): Payer: Self-pay | Admitting: Psychiatry

## 2022-01-15 DIAGNOSIS — F324 Major depressive disorder, single episode, in partial remission: Secondary | ICD-10-CM

## 2022-01-27 ENCOUNTER — Telehealth (HOSPITAL_COMMUNITY): Payer: Self-pay | Admitting: Licensed Clinical Social Worker

## 2022-01-27 ENCOUNTER — Ambulatory Visit (HOSPITAL_COMMUNITY): Payer: 59 | Admitting: Licensed Clinical Social Worker

## 2022-01-27 NOTE — Telephone Encounter (Signed)
The therapist receives the following email from [place] mother:  "Hi! Phone appt available? Ian Lamb not feeling well today: sore throat. His number is [phone]  Please let us know. Thank you! "  which is followed by a telephone call asking that Ian Lamb have a telephonic session today.  The therapist calls Ian Lamb for his appointment and leaves a HIPAA-compliant voicemail with his direct callback number and the reception number should he need to reschedule.    [address] [place], LCSW, [facility], LCAS 01/27/2022

## 2022-02-10 ENCOUNTER — Other Ambulatory Visit (HOSPITAL_COMMUNITY): Payer: Self-pay | Admitting: Psychiatry

## 2022-02-10 DIAGNOSIS — F324 Major depressive disorder, single episode, in partial remission: Secondary | ICD-10-CM

## 2022-02-10 DIAGNOSIS — F411 Generalized anxiety disorder: Secondary | ICD-10-CM

## 2022-02-12 ENCOUNTER — Telehealth (HOSPITAL_BASED_OUTPATIENT_CLINIC_OR_DEPARTMENT_OTHER): Payer: 59 | Admitting: Psychiatry

## 2022-02-12 DIAGNOSIS — F17210 Nicotine dependence, cigarettes, uncomplicated: Secondary | ICD-10-CM

## 2022-02-12 DIAGNOSIS — F1220 Cannabis dependence, uncomplicated: Secondary | ICD-10-CM

## 2022-02-12 DIAGNOSIS — F5105 Insomnia due to other mental disorder: Secondary | ICD-10-CM

## 2022-02-12 DIAGNOSIS — F411 Generalized anxiety disorder: Secondary | ICD-10-CM

## 2022-02-12 DIAGNOSIS — F324 Major depressive disorder, single episode, in partial remission: Secondary | ICD-10-CM | POA: Diagnosis not present

## 2022-02-12 DIAGNOSIS — F1020 Alcohol dependence, uncomplicated: Secondary | ICD-10-CM | POA: Diagnosis not present

## 2022-02-12 DIAGNOSIS — F17200 Nicotine dependence, unspecified, uncomplicated: Secondary | ICD-10-CM

## 2022-02-12 DIAGNOSIS — F99 Mental disorder, not otherwise specified: Secondary | ICD-10-CM

## 2022-02-12 DIAGNOSIS — F900 Attention-deficit hyperactivity disorder, predominantly inattentive type: Secondary | ICD-10-CM

## 2022-02-12 MED ORDER — VENLAFAXINE HCL ER 75 MG PO CP24: 225.0000 mg | ORAL_CAPSULE | Freq: Every day | ORAL | 0 refills | Status: DC

## 2022-02-12 MED ORDER — PROPRANOLOL HCL 20 MG PO TABS: 20.0000 mg | ORAL_TABLET | Freq: Two times a day (BID) | ORAL | 0 refills | Status: DC | PRN

## 2022-02-12 MED ORDER — DIVALPROEX SODIUM ER 250 MG PO TB24: 250.0000 mg | ORAL_TABLET | Freq: Every day | ORAL | 0 refills | Status: DC

## 2022-02-12 MED ORDER — NALTREXONE HCL 50 MG PO TABS: 50.0000 mg | ORAL_TABLET | Freq: Every day | ORAL | 0 refills | Status: DC
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
## 2022-03-09 ENCOUNTER — Encounter: Payer: Self-pay | Admitting: Family Medicine

## 2022-04-03 ENCOUNTER — Telehealth (HOSPITAL_COMMUNITY): Payer: Self-pay

## 2022-04-03 NOTE — Telephone Encounter (Signed)
Mother of patient called to request a refill for the following medication please advise     naltrexone (DEPADE) 50 MG tablet 90 tablet 0 02/12/2022    Sig - Route: Take 1 tablet (50 mg total) by mouth daily. - Oral   Sent to pharmacy as: naltrexone (DEPADE) 50 MG tablet   E-Prescribing Status: Receipt confirmed by pharmacy (02/12/2022 10:18 AM EDT

## 2022-04-06 ENCOUNTER — Ambulatory Visit (INDEPENDENT_AMBULATORY_CARE_PROVIDER_SITE_OTHER): Payer: Managed Care, Other (non HMO) | Admitting: Family Medicine

## 2022-04-06 ENCOUNTER — Encounter: Payer: Self-pay | Admitting: Family Medicine

## 2022-04-06 ENCOUNTER — Ambulatory Visit (INDEPENDENT_AMBULATORY_CARE_PROVIDER_SITE_OTHER)
Admission: RE | Admit: 2022-04-06 | Discharge: 2022-04-06 | Disposition: A | Discharge status: Home / Self Care | Payer: Managed Care, Other (non HMO) | Source: Ambulatory Visit | Attending: Family Medicine | Admitting: Family Medicine

## 2022-04-06 ENCOUNTER — Encounter: Payer: Self-pay | Admitting: *Deleted

## 2022-04-06 VITALS — BP 118/78 | HR 88 | Temp 97.8°F | Ht 71.0 in | Wt 185.0 lb

## 2022-04-06 DIAGNOSIS — M79642 Pain in left hand: Secondary | ICD-10-CM

## 2022-04-06 NOTE — Patient Instructions (Signed)
Get back in the brace and let me know if you don't get a call about seeing ortho.  Keep going with counseling.  Take care.  Glad to see you.
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
## 2022-04-08 DIAGNOSIS — M79642 Pain in left hand: Secondary | ICD-10-CM | POA: Insufficient documentation

## 2022-04-08 NOTE — Assessment & Plan Note (Signed)
Fracture noted on x-ray and discussed with patient.  Refer to orthopedics.  He has a brace he can restart using today.  I think that makes sense to use for now so that he can have short-term orthopedic follow-up.  He agrees with plan.  See above about mental health follow-up.  At this point still okay for outpatient follow-up and I routed this note to psychiatry with appreciation.

## 2022-04-09 ENCOUNTER — Other Ambulatory Visit (HOSPITAL_COMMUNITY): Payer: Self-pay | Admitting: Psychiatry

## 2022-04-09 DIAGNOSIS — F1020 Alcohol dependence, uncomplicated: Secondary | ICD-10-CM

## 2022-04-09 MED ORDER — NALTREXONE HCL 50 MG PO TABS: 50.0000 mg | ORAL_TABLET | Freq: Every day | ORAL | 0 refills | Status: DC

## 2022-04-23 ENCOUNTER — Telehealth (HOSPITAL_BASED_OUTPATIENT_CLINIC_OR_DEPARTMENT_OTHER): Payer: 59 | Admitting: Psychiatry

## 2022-04-23 DIAGNOSIS — F1220 Cannabis dependence, uncomplicated: Secondary | ICD-10-CM

## 2022-04-23 DIAGNOSIS — F411 Generalized anxiety disorder: Secondary | ICD-10-CM

## 2022-04-23 DIAGNOSIS — F1020 Alcohol dependence, uncomplicated: Secondary | ICD-10-CM | POA: Diagnosis not present

## 2022-04-23 DIAGNOSIS — F99 Mental disorder, not otherwise specified: Secondary | ICD-10-CM

## 2022-04-23 DIAGNOSIS — F900 Attention-deficit hyperactivity disorder, predominantly inattentive type: Secondary | ICD-10-CM

## 2022-04-23 DIAGNOSIS — F5105 Insomnia due to other mental disorder: Secondary | ICD-10-CM

## 2022-04-23 DIAGNOSIS — F331 Major depressive disorder, recurrent, moderate: Secondary | ICD-10-CM

## 2022-04-23 MED ORDER — NALTREXONE HCL 50 MG PO TABS: 50.0000 mg | ORAL_TABLET | Freq: Every day | ORAL | 0 refills | Status: DC

## 2022-04-23 MED ORDER — DIVALPROEX SODIUM ER 250 MG PO TB24: 250.0000 mg | ORAL_TABLET | Freq: Every day | ORAL | 0 refills | Status: DC

## 2022-04-23 MED ORDER — PROPRANOLOL HCL 20 MG PO TABS: 20.0000 mg | ORAL_TABLET | Freq: Two times a day (BID) | ORAL | 0 refills | Status: DC | PRN

## 2022-04-23 MED ORDER — VENLAFAXINE HCL ER 75 MG PO CP24: 225.0000 mg | ORAL_CAPSULE | Freq: Every day | ORAL | 0 refills | Status: DC

## 2022-04-23 NOTE — Progress Notes (Signed)
Virtual Visit via Video Note  I connected with Ian Lamb on 04/23/22 at 10:00 AM EST by  a video enabled telemedicine application and verified that I am speaking with the correct person using two identifiers.  Location: Patient: home Provider: office   I discussed the limitations of evaluation and management by telemedicine and the availability of in person appointments. The patient expressed understanding and agreed to proceed.  History of Present Illness: "I have been sleeping really well. I have been taking my meds everyday consistently". Ian Lamb has only missed 2 night time doses in the last 2 months. His depression is ongoing but he is dealing with it better. He feels depressed about 2-3x/week when he has nothing to do. He has random nightmares about loved one who have passed away. This happens about twice a week and he is able to deal with it. He denies isolation and anhedonia. Ian Lamb denies hopelessness. He denies passive thoughts of death and SI/HI. He gets sad when he thinks about his Limpert who committed suicide about 4 months ago. Ian Lamb struggles with anxiety on a daily basis on/off thru out the day. He gets headaches and lightheaded. Drinking water and resting help to calm down. He also takes the Propranolol sometimes. He tries to save it so he won't get dependent on it. He usually takes it when he gets angry.   He is not taking Adderall because he is not working right now. He will start it back when he goes back. His focus is a little better now and he thinks it is due to decreased alcohol and marijuana use. He has stopped going out to bars. Ian Lamb has 6 beers about once/week. Ian Lamb is smoking marijuana daily and is thinking of quitting. He might want to join the miliary after his grandfather dies.    Observations/Objective: Psychiatric Specialty Exam: ROS  There were no vitals taken for this visit.There is no height or weight on file to calculate BMI.  General Appearance: Casual  Eye  Contact:  Good  Speech:  Clear and Coherent and Normal Rate  Volume:  Normal  Mood:  Anxious and Depressed  Affect:  Full Range  Thought Process:  Goal Directed, Linear, and Descriptions of Associations: Intact  Orientation:  Full (Time, Place, and Person)  Thought Content:  Logical  Suicidal Thoughts:  No  Homicidal Thoughts:  No  Memory:  Immediate;   Good  Judgement:  Good  Insight:  Good  Psychomotor Activity:  Normal  Concentration:  Concentration: Good  Recall:  Good  Fund of Knowledge:  Good  Language:  Good  Akathisia:  No  Handed:  Right  AIMS (if indicated):     Assets:  Communication Skills Desire for Improvement Financial Resources/Insurance Housing Leisure Time Resilience Social Support Talents/Skills Transportation Vocational/Educational  ADL's:  Intact  Cognition:  WNL  Sleep:        Assessment and Plan:     04/23/2022   10:03 AM 02/12/2022   10:08 AM 10/30/2021    2:07 PM 09/11/2021    8:49 AM 08/07/2021    1:44 PM  Depression screen PHQ 2/9  Decreased Interest 0 0 1 2 1   Down, Depressed, Hopeless 1 1 3 2 1   PHQ - 2 Score 1 1 4 4 2   Altered sleeping   3 2 1   Tired, decreased energy   1 2 1   Change in appetite   0  0  Feeling bad or failure about yourself  3  2  Trouble concentrating   3  3  Moving slowly or fidgety/restless   0  0  Suicidal thoughts   1  0  PHQ-9 Score   15 8 9   Difficult doing work/chores   Very difficult  Very difficult    Flowsheet Row Video Visit from 04/23/2022 in [facility] Video Visit from 02/12/2022 in [facility] ED to Hosp-Admission (Discharged) from 12/15/2021 in [place] No Risk Error: Q3, 4, or 5 should not be populated when Q2 is No No Risk           Status of current problems: improved mood and anxiety, decreased alcohol use   Medication management with supportive  therapy. Risks and benefits, side effects and alternative treatment options discussed with patient. Pt was given an opportunity to ask questions about medication, illness, and treatment. All current psychiatric medications have been reviewed and discussed with the patient and adjusted as clinically appropriate.  Pt verbalized understanding and verbal consent obtained for treatment.  Meds:  1. MDD (major depressive disorder), recurrent episode, moderate (HCC) - divalproex (DEPAKOTE ER) 250 MG 24 hr tablet; Take 1 tablet (250 mg total) by mouth daily.  Dispense: 90 tablet; Refill: 0 - venlafaxine XR (EFFEXOR XR) 75 MG 24 hr capsule; Take 3 capsules (225 mg total) by mouth daily.  Dispense: 270 capsule; Refill: 0  2. GAD (generalized anxiety disorder) - propranolol (INDERAL) 20 MG tablet; Take 1 tablet (20 mg total) by mouth 2 (two) times daily as needed (anxiety).  Dispense: 180 tablet; Refill: 0 - venlafaxine XR (EFFEXOR XR) 75 MG 24 hr capsule; Take 3 capsules (225 mg total) by mouth daily.  Dispense: 270 capsule; Refill: 0  3. Cannabis use disorder, severe, dependence ([place])  4. Alcohol use disorder, severe, dependence (HCC) - naltrexone (DEPADE) 50 MG tablet; Take 1 tablet (50 mg total) by mouth daily.  Dispense: 90 tablet; Refill: 0  5. Insomnia due to other mental disorder  6. Attention deficit hyperactivity disorder (ADHD), predominantly inattentive type        Therapy: brief supportive therapy provided. Discussed psychosocial stressors in detail. Recommended pt stop all drug and alcohol use   Collaboration of Care: Other none  Patient/Guardian was advised Release of Information must be obtained prior to any record release in order to collaborate their care with an outside provider. Patient/Guardian was advised if they have not already done so to contact the registration department to sign all necessary forms in order for [place] to release information regarding their care.   Consent:  Patient/Guardian gives verbal consent for treatment and assignment of benefits for services provided during this visit. Patient/Guardian expressed understanding and agreed to proceed.       Follow Up Instructions: Follow up in 2-3 months or sooner if needed    I discussed the assessment and treatment plan with the patient. The patient was provided an opportunity to ask questions and all were answered. The patient agreed with the plan and demonstrated an understanding of the instructions.   The patient was advised to call back or seek an in-person evaluation if the symptoms worsen or if the condition fails to improve as anticipated.  I provided 13 minutes of non-face-to-face time during this encounter.   [name], MD

## 2022-06-18 ENCOUNTER — Other Ambulatory Visit (HOSPITAL_COMMUNITY): Payer: Self-pay | Admitting: *Deleted

## 2022-06-18 ENCOUNTER — Telehealth (HOSPITAL_COMMUNITY): Payer: Self-pay | Admitting: *Deleted

## 2022-06-18 DIAGNOSIS — F1020 Alcohol dependence, uncomplicated: Secondary | ICD-10-CM

## 2022-06-18 MED ORDER — NALTREXONE HCL 50 MG PO TABS: 50.0000 mg | ORAL_TABLET | Freq: Every day | ORAL | 0 refills | Status: DC

## 2022-06-18 NOTE — Telephone Encounter (Signed)
Mother, Webb Silversmith, called to request bridge on the Naltrexone as [facility] on [address] was only able to dispense #30 as they have been out of stock. Bridge sent to [facility] on [address]. Mother wanted to advise that pt has not been compliant with the Depakote 250 mg qd and they've noticed a mood shift as in increased irritability and easily angered. FYI. Pt has an appointment on 07/16/22.

## 2022-07-13 ENCOUNTER — Ambulatory Visit (INDEPENDENT_AMBULATORY_CARE_PROVIDER_SITE_OTHER)
Admission: RE | Admit: 2022-07-13 | Discharge: 2022-07-13 | Disposition: A | Discharge status: Home / Self Care | Payer: Managed Care, Other (non HMO) | Source: Ambulatory Visit | Attending: Nurse Practitioner

## 2022-07-13 ENCOUNTER — Encounter: Payer: Self-pay | Admitting: Nurse Practitioner

## 2022-07-13 ENCOUNTER — Ambulatory Visit (INDEPENDENT_AMBULATORY_CARE_PROVIDER_SITE_OTHER): Payer: Managed Care, Other (non HMO) | Admitting: Nurse Practitioner

## 2022-07-13 VITALS — BP 102/62 | HR 58 | Temp 98.1°F | Resp 16 | Ht 71.0 in | Wt 183.2 lb

## 2022-07-13 DIAGNOSIS — M25532 Pain in left wrist: Secondary | ICD-10-CM | POA: Insufficient documentation

## 2022-07-13 DIAGNOSIS — Z8781 Personal history of (healed) traumatic fracture: Secondary | ICD-10-CM

## 2022-07-13 NOTE — Assessment & Plan Note (Signed)
Was seen in December 2023 with fracture to the hand splinted and sent to orthopedist.  Patient states he did go to orthopedist.  Was told to wear the splint at night and it would fix itself.  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  We will reimage today to see if fracture is healed pending hand and wrist x-ray

## 2022-07-13 NOTE — Assessment & Plan Note (Signed)
Pending x-ray in office.  No pain on movement or palpation in office.  Patient has full active range of motion.
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
## 2022-07-13 NOTE — Patient Instructions (Signed)
Nice to see you today I will be in touch with the xray results once I have them Follow up as needed

## 2022-07-15 ENCOUNTER — Telehealth: Payer: Self-pay | Admitting: Nurse Practitioner

## 2022-07-15 DIAGNOSIS — S52512A Displaced fracture of left radial styloid process, initial encounter for closed fracture: Secondary | ICD-10-CM

## 2022-07-15 NOTE — Telephone Encounter (Signed)
-----   Message from Barkley Bruns, [place] sent at 07/15/2022  8:56 AM EDT -----  ----- Message ----- From: Michela Pitcher, NP Sent: 07/14/2022   7:23 AM EDT To: Lovenia Shuck  Notified via My Chart   See below. If he does not have the ortho info. I can place another referral

## 2022-07-15 NOTE — Telephone Encounter (Signed)
Noted. Thanks.

## 2022-07-15 NOTE — Telephone Encounter (Signed)
Left message to return call to our office.  

## 2022-07-15 NOTE — Telephone Encounter (Signed)
I will place a referral to Ortho to make sure he has somewhere to be seen at  Will CC PCP for [facility]

## 2022-07-16 ENCOUNTER — Telehealth (HOSPITAL_BASED_OUTPATIENT_CLINIC_OR_DEPARTMENT_OTHER): Payer: 59 | Admitting: Psychiatry

## 2022-07-16 DIAGNOSIS — F411 Generalized anxiety disorder: Secondary | ICD-10-CM

## 2022-07-16 DIAGNOSIS — F1020 Alcohol dependence, uncomplicated: Secondary | ICD-10-CM

## 2022-07-16 DIAGNOSIS — F331 Major depressive disorder, recurrent, moderate: Secondary | ICD-10-CM | POA: Diagnosis not present

## 2022-07-16 MED ORDER — VENLAFAXINE HCL ER 75 MG PO CP24: 225.0000 mg | ORAL_CAPSULE | Freq: Every day | ORAL | 0 refills | Status: DC

## 2022-07-16 MED ORDER — DIVALPROEX SODIUM ER 250 MG PO TB24: 250.0000 mg | ORAL_TABLET | Freq: Every day | ORAL | 0 refills | Status: DC

## 2022-07-16 MED ORDER — PROPRANOLOL HCL 10 MG PO TABS: 10.0000 mg | ORAL_TABLET | Freq: Two times a day (BID) | ORAL | 0 refills | Status: DC | PRN

## 2022-07-16 MED ORDER — NALTREXONE HCL 50 MG PO TABS: 50.0000 mg | ORAL_TABLET | Freq: Every day | ORAL | 0 refills | Status: DC

## 2022-07-16 NOTE — Progress Notes (Signed)
Virtual Visit via Video Note  I connected with Ian Lamb on 07/16/22 at 10:00 AM EDT by a video enabled telemedicine application and verified that I am speaking with the correct person using two identifiers.  Location: Patient: home Provider: office   I discussed the limitations of evaluation and management by telemedicine and the availability of in person appointments. The patient expressed understanding and agreed to proceed.  History of Present Illness: Ian Lamb shares that his hand is still healing and he doesn't think he will be able to work again until the fall. Ian Lamb has been helping out his grandfather. Ian Lamb has been staying busy to manage his anger. He has been processing when he feels anger instead of reacting. One of his pills makes him lightheaded at times. Ian Lamb denies any falls. Ian Lamb does not want to take any stimulants until he starts working again. He really likes Depakote because it helps to calm his anger. Ian Lamb is taking Naltrexone and it helps to decrease his desire for alcohol. Now he drinks 2 beers about 2x/month. Ian Lamb continues to smoke marijuana about 4x/month. The depression is unchanged but he has been more open about his feelings. He often gets mad at his family due to their behaviors and is to the point to cutting some people out of his life. He gets depressed everyday for about 30 minutes. He denies anhedonia and isolation. He denies passive and active SI/HI. He gets anxious 4-5x/week and feel like panic attacks. He is trying not to depend on his friends as much and do more for himself.    Observations/Objective: Psychiatric Specialty Exam: ROS  There were no vitals taken for this visit.There is no height or weight on file to calculate BMI.  General Appearance: Casual and Disheveled  Eye Contact:  Fair  Speech:  Clear and Coherent and Slow  Volume:  Normal  Mood:  Anxious and Depressed  Affect:  Full Range  Thought Process:  Goal Directed, Linear, and Descriptions  of Associations: Intact  Orientation:  Full (Time, Place, and Person)  Thought Content:  Logical  Suicidal Thoughts:  No  Homicidal Thoughts:  No  Memory:  Immediate;   Good  Judgement:  Good  Insight:  Good  Psychomotor Activity:  Normal  Concentration:  Concentration: Good  Recall:  Good  Fund of Knowledge:  Good  Language:  Good  Akathisia:  No  Handed:  Right  AIMS (if indicated):     Assets:  Communication Skills Desire for Improvement Financial Resources/Insurance Housing Resilience Social Support Talents/Skills Transportation Vocational/Educational  ADL's:  Intact  Cognition:  WNL  Sleep:        Assessment and Plan:     04/23/2022   10:03 AM 02/12/2022   10:08 AM 10/30/2021    2:07 PM 09/11/2021    8:49 AM 08/07/2021    1:44 PM  Depression screen PHQ 2/9  Decreased Interest 0 0 1 2 1   Down, Depressed, Hopeless 1 1 3 2 1   PHQ - 2 Score 1 1 4 4 2   Altered sleeping   3 2 1   Tired, decreased energy   1 2 1   Change in appetite   0  0  Feeling bad or failure about yourself    3  2  Trouble concentrating   3  3  Moving slowly or fidgety/restless   0  0  Suicidal thoughts   1  0  PHQ-9 Score   15 8 9   Difficult doing work/chores  Very difficult  Very difficult    Flowsheet Row Video Visit from 04/23/2022 in [facility] Video Visit from 02/12/2022 in [facility] ED to Hosp-Admission (Discharged) from 12/15/2021 in [place] No Risk Error: Q3, 4, or 5 should not be populated when Q2 is No No Risk          Status of current problems: decreased alcohol use, mood and anxiety are ongoing   Medication management with supportive therapy. Risks and benefits, side effects and alternative treatment options discussed with patient. Pt was given an opportunity to ask questions about medication, illness, and treatment. All current  psychiatric medications have been reviewed and discussed with the patient and adjusted as clinically appropriate.  Pt verbalized understanding and verbal consent obtained for treatment.  Meds: decrease dose of Propranolol 10mg  BID prn due to light headed feelings.  1. MDD (major depressive disorder), recurrent episode, moderate (HCC) - divalproex (DEPAKOTE ER) 250 MG 24 hr tablet; Take 1 tablet (250 mg total) by mouth daily.  Dispense: 90 tablet; Refill: 0 - venlafaxine XR (EFFEXOR XR) 75 MG 24 hr capsule; Take 3 capsules (225 mg total) by mouth daily.  Dispense: 270 capsule; Refill: 0  2. Alcohol use disorder, severe, dependence (HCC) - naltrexone (DEPADE) 50 MG tablet; Take 1 tablet (50 mg total) by mouth daily. bridge  Dispense: 90 tablet; Refill: 0  3. GAD (generalized anxiety disorder) - venlafaxine XR (EFFEXOR XR) 75 MG 24 hr capsule; Take 3 capsules (225 mg total) by mouth daily.  Dispense: 270 capsule; Refill: 0 - propranolol (INDERAL) 10 MG tablet; Take 1 tablet (10 mg total) by mouth 2 (two) times daily as needed (anxiety).  Dispense: 180 tablet; Refill: 0     Labs: will order at next visit.    Therapy: brief supportive therapy provided. Discussed psychosocial stressors in detail.   Collaboration of Care: Other therapy each week  Patient/Guardian was advised Release of Information must be obtained prior to any record release in order to collaborate their care with an outside provider. Patient/Guardian was advised if they have not already done so to contact the registration department to sign all necessary forms in order for [place] to release information regarding their care.   Consent: Patient/Guardian gives verbal consent for treatment and assignment of benefits for services provided during this visit. Patient/Guardian expressed understanding and agreed to proceed.      Follow Up Instructions: Follow up in 2-3 months or sooner if needed    I discussed the assessment and  treatment plan with the patient. The patient was provided an opportunity to ask questions and all were answered. The patient agreed with the plan and demonstrated an understanding of the instructions.   The patient was advised to call back or seek an in-person evaluation if the symptoms worsen or if the condition fails to improve as anticipated.  I provided 15 minutes of non-face-to-face time during this encounter.   [name], MD

## 2022-07-24 ENCOUNTER — Other Ambulatory Visit: Payer: Self-pay | Admitting: Family Medicine

## 2022-07-24 DIAGNOSIS — S52512A Displaced fracture of left radial styloid process, initial encounter for closed fracture: Secondary | ICD-10-CM

## 2022-07-29 ENCOUNTER — Other Ambulatory Visit: Payer: Self-pay | Admitting: Family Medicine

## 2022-07-29 MED ORDER — TRIAMCINOLONE ACETONIDE 0.1 % EX CREA: 1.0000 | TOPICAL_CREAM | Freq: Two times a day (BID) | CUTANEOUS | 1 refills | Status: DC | PRN

## 2022-08-04 ENCOUNTER — Encounter: Payer: Self-pay | Admitting: Orthopaedic Surgery

## 2022-08-04 ENCOUNTER — Other Ambulatory Visit (INDEPENDENT_AMBULATORY_CARE_PROVIDER_SITE_OTHER): Payer: Managed Care, Other (non HMO)

## 2022-08-04 ENCOUNTER — Ambulatory Visit (INDEPENDENT_AMBULATORY_CARE_PROVIDER_SITE_OTHER): Payer: Managed Care, Other (non HMO) | Admitting: Orthopaedic Surgery

## 2022-08-04 DIAGNOSIS — M25532 Pain in left wrist: Secondary | ICD-10-CM | POA: Diagnosis not present
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
## 2022-08-24 ENCOUNTER — Encounter (HOSPITAL_COMMUNITY): Payer: Self-pay

## 2022-09-03 ENCOUNTER — Telehealth (HOSPITAL_BASED_OUTPATIENT_CLINIC_OR_DEPARTMENT_OTHER): Payer: Managed Care, Other (non HMO) | Admitting: Psychiatry

## 2022-09-03 ENCOUNTER — Encounter: Payer: Self-pay | Admitting: Family Medicine

## 2022-09-03 ENCOUNTER — Telehealth (HOSPITAL_COMMUNITY): Payer: Self-pay | Admitting: Psychiatry

## 2022-09-03 ENCOUNTER — Telehealth: Payer: Self-pay | Admitting: Orthopaedic Surgery

## 2022-09-03 DIAGNOSIS — F900 Attention-deficit hyperactivity disorder, predominantly inattentive type: Secondary | ICD-10-CM

## 2022-09-03 DIAGNOSIS — F1020 Alcohol dependence, uncomplicated: Secondary | ICD-10-CM

## 2022-09-03 DIAGNOSIS — F331 Major depressive disorder, recurrent, moderate: Secondary | ICD-10-CM | POA: Diagnosis not present

## 2022-09-03 DIAGNOSIS — F1210 Cannabis abuse, uncomplicated: Secondary | ICD-10-CM

## 2022-09-03 DIAGNOSIS — F411 Generalized anxiety disorder: Secondary | ICD-10-CM | POA: Diagnosis not present

## 2022-09-03 DIAGNOSIS — F1090 Alcohol use, unspecified, uncomplicated: Secondary | ICD-10-CM

## 2022-09-03 MED ORDER — DIVALPROEX SODIUM ER 500 MG PO TB24: 500.0000 mg | ORAL_TABLET | Freq: Every day | ORAL | 0 refills | Status: DC

## 2022-09-03 MED ORDER — NALTREXONE HCL 50 MG PO TABS: 50.0000 mg | ORAL_TABLET | Freq: Every day | ORAL | 0 refills | Status: DC

## 2022-09-03 MED ORDER — VENLAFAXINE HCL ER 75 MG PO CP24: ORAL_CAPSULE | ORAL | 0 refills | Status: DC

## 2022-09-03 MED ORDER — PROPRANOLOL HCL 20 MG PO TABS: 20.0000 mg | ORAL_TABLET | Freq: Two times a day (BID) | ORAL | 0 refills | Status: DC | PRN

## 2022-09-03 NOTE — Telephone Encounter (Signed)
Patient's mom called. Says son lost the cast Dr. Roda Shutters gave him. Call mom at [phone]
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
## 2022-09-15 ENCOUNTER — Other Ambulatory Visit (HOSPITAL_COMMUNITY): Payer: Self-pay | Admitting: Psychiatry

## 2022-09-15 DIAGNOSIS — F331 Major depressive disorder, recurrent, moderate: Secondary | ICD-10-CM

## 2022-09-17 ENCOUNTER — Telehealth (HOSPITAL_COMMUNITY): Payer: Self-pay | Admitting: *Deleted

## 2022-09-17 NOTE — Telephone Encounter (Signed)
Pt's mother, Thurston Hole, called requesting  #30 Rx of the Naltrexone 50 mg as current Rx sent for #15 and pt will not see Dr. Lolly Mustache until 10/15/22. Please review and advise.

## 2022-10-15 ENCOUNTER — Ambulatory Visit (HOSPITAL_BASED_OUTPATIENT_CLINIC_OR_DEPARTMENT_OTHER): Payer: Medicaid Other | Admitting: Psychiatry

## 2022-10-15 ENCOUNTER — Encounter (HOSPITAL_COMMUNITY): Payer: Self-pay | Admitting: Psychiatry

## 2022-10-15 VITALS — Wt 187.0 lb

## 2022-10-15 DIAGNOSIS — F331 Major depressive disorder, recurrent, moderate: Secondary | ICD-10-CM | POA: Diagnosis not present

## 2022-10-15 DIAGNOSIS — F411 Generalized anxiety disorder: Secondary | ICD-10-CM

## 2022-10-15 DIAGNOSIS — F1210 Cannabis abuse, uncomplicated: Secondary | ICD-10-CM

## 2022-10-15 DIAGNOSIS — F900 Attention-deficit hyperactivity disorder, predominantly inattentive type: Secondary | ICD-10-CM

## 2022-10-15 DIAGNOSIS — F1020 Alcohol dependence, uncomplicated: Secondary | ICD-10-CM

## 2022-10-15 MED ORDER — NALTREXONE HCL 50 MG PO TABS
50.0000 mg | ORAL_TABLET | Freq: Every day | ORAL | 1 refills | Status: DC
Start: 2022-10-15 — End: 2023-09-27

## 2022-10-15 MED ORDER — PROPRANOLOL HCL 20 MG PO TABS
20.0000 mg | ORAL_TABLET | Freq: Two times a day (BID) | ORAL | 0 refills | Status: DC | PRN
Start: 2022-10-15 — End: 2023-09-27

## 2022-10-15 MED ORDER — DIVALPROEX SODIUM ER 500 MG PO TB24
500.0000 mg | ORAL_TABLET | Freq: Every day | ORAL | 1 refills | Status: DC
Start: 2022-10-15 — End: 2023-11-29

## 2022-10-15 MED ORDER — VENLAFAXINE HCL ER 75 MG PO CP24
75.0000 mg | ORAL_CAPSULE | Freq: Every day | ORAL | 1 refills | Status: DC
Start: 2022-10-15 — End: 2023-09-27

## 2022-10-15 NOTE — Progress Notes (Signed)
Psychiatric Initial Adult Assessment   Virtual Visit via Video Note  I connected with Ian Lamb on 10/15/22 at  2:40 PM EDT by a video enabled telemedicine application and verified that I am speaking with the correct person using two identifiers.  Location: Patient: Home Provider: Office   I discussed the limitations of evaluation and management by telemedicine and the availability of in person appointments. The patient expressed understanding and agreed to proceed.   Patient Identification: Ian Lamb MRN:  [id number] Date of Evaluation:  10/15/2022  Referral Source: Previous Psychiatrist Chief Complaint:   Chief Complaint  Patient presents with   Establish Care   ADHD   Agitation   Alcohol Problem   Visit Diagnosis:    ICD-10-CM   1. MDD (major depressive disorder), recurrent episode, moderate (HCC)  F33.1     2. Alcohol use disorder, severe, dependence (HCC)  F10.20     3. GAD (generalized anxiety disorder)  F41.1       History of Present Illness: Patient is 22 year old Caucasian, single, currently unemployed man who is seen by Dr. Michae Kava now like to continue care with this Clinical research associate.  His psychiatrist is leaving the practice.  Patient has long history of psychiatric illness.  He was given the diagnosis of ADHD and took stimulant in the past by Dr. Milana Kidney.  He is seeing Dr. Michae Kava for the past few years.  Patient also had a history of alcohol abuse, marijuana abuse, severe anxiety and anger issues.  Patient is taking venlafaxine, Depakote, propranolol and naltrexone.  Patient reported he is currently not working after he injured his left arm.  Patient told he was drinking and fell off but likely did not require surgery but had a fracture.  He is driving but does not want to go back to work until he fully recovered.  He is currently not taking any pain medicine.  He reported his anxiety depression is stable.  He still have moments of anger or irritability but denies  any suicidal thoughts, hallucination or any paranoia.  He continues to drink 4-6 beers on and off when he is with his friends.  He also smoked marijuana and the last use was last week.  He understand he has anger issues and he need to control his drinking.  He feels the Depakote helping his sleep and naltrexone helping his alcohol craving.  He had stopped taking the venlafaxine after he noticed maybe it is causing worsening of anger but he started to have more anger issues and resumed taking 1 pill a day.  He was prescribed 3 pills a day but when he weaned himself off 3 weeks later his symptoms come back.  His medication information was obtained from his mother who was initially present in the beginning of the session.  Patient lives with his mother and stepfather.  He is close to his biological father who lives in [place].  He was working at his [employer] but admitted sometimes do not get along with the coworkers.  He is not sure if he like to go back to work at [employer] as thinking to work somewhere else.  He sleeps fair.  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  He is also very close to his grandpa.  Patient has good social network and friends.  He admitted to enjoy drinking with the friends but realized the need to cut it down.  His appetite is okay.  His weight is stable.  He see Dr. Para March for his primary care however has not seen in a while.  Denies any Lamb drug use.  He is not taking any stimulant for a while because he is not working.  He like to go back on the stimulant but he understand he need to completely stop drinking and drugs.  He is currently single.  His current medicine is venlafaxine 75 mg daily, Depakote 500 mg at bedtime, propranolol 20 mg prescribed twice a day but usually take once a day and if needed he takes second dose and naltrexone 50 mg daily.  So far he is tolerating his medication and reported no side  effects, tremors or shakes or any EPS.  Associated Signs/Symptoms: Depression Symptoms:  depressed mood, fatigue, anxiety, disturbed sleep, (Hypo) Manic Symptoms:  Impulsivity, Irritable Mood, Labiality of Mood, Anxiety Symptoms:  Excessive Worry, Psychotic Symptoms:   none PTSD Symptoms: denies  Past Psychiatric History: History of ADHD and seen Dr. Milana Kidney in the past.  Prescribed BuSpar, Focalin, Wellbutrin, Prozac, Seroquel, trazodone, Prozac, clonidine, Xanax, Unisom, hydroxyzine, Zoloft.  Saw Dr. Michae Kava recently.  History of anger issues, poor impulse control, drinking, cannabis use.  Made impulsive decision while intoxicated and busted left hand require emergency room visit.  Recently had a fall after the drink and fractured his left hand.  History of fights and arguments while intoxicated.  No history of suicidal attempt, psychosis or inpatient treatment.  Previous Psychotropic Medications: Yes   Substance Abuse History in the last 12 months:  Yes.    Consequences of Substance Abuse: History of drinking and cannabis use.  Enjoy drinking mostly when with friends.  No history of DUI, seizures.  No history of Lamb drug use  Past Medical History:  Past Medical History:  Diagnosis Date   Abscess 2011   Left calf   ADD (attention deficit disorder)    Anxiety    Depression    Family history of adverse reaction to anesthesia     Past Surgical History:  Procedure Laterality Date   MOUTH SURGERY     in childhood   NERVE AND TENDON REPAIR Right 12/15/2021   Procedure: RIGHT HAND TENDON REPAIR;  Surgeon: Mack Hook, MD;  Location: [facility] OR;  Service: Orthopedics;  Laterality: Right;   NO PAST SURGERIES     TYMPANOSTOMY TUBE PLACEMENT Bilateral     Family Psychiatric History: Reviewed.  Family History:  Family History  Problem Relation Age of Onset   Bipolar disorder Maternal Uncle    ADD / ADHD Neg Hx        Positive family history    Social History:   Social History    Socioeconomic History   Marital status: Single    Spouse name: Not on file   Number of children: 0   Years of education: 11   Highest education level: Not on file  Occupational History   Not on file  Tobacco Use   Smoking status: Every Day    Types: E-cigarettes   Smokeless tobacco: Former    Types: Chew    Quit date: 10/01/2020   Tobacco comments:    vaping daily  Vaping Use   Vaping Use: Every day  Substance and Sexual Activity   Alcohol use: Not Currently    Alcohol/week: 18.0 standard drinks of alcohol    Types: 18 Cans of beer per week    Comment: drinks 6-12 beers each time he drinks about  2-3x/week   Drug use: Yes    Frequency: 7.0 times per week    Types: Marijuana   Sexual activity: Yes    Birth control/protection: Condom  Other Topics Concern   Not on file  Social History Narrative   Parents divorced.  Lives with mother and step dad. He gets along with his mom and step dad.  [employer] and will be starting 12 grade in Aug 2019      Father travels due to work and lives in [place], [place]. Pt see's him every 2 weeks. Pt looks up to his biological dad a lot.      Pt is an only child but has a lot of cousins      Denies legal hx      Play track and lacrosse. He likes swimming.   Social Determinants of Health   Financial Resource Strain: Not on file  Food Insecurity: Not on file  Transportation Needs: Not on file  Physical Activity: Not on file  Stress: Not on file  Social Connections: Not on file    Additional Social History: Patient born and raised in [place].  Finished high school from [place] but did not finish college.  Parents separated when he was very young.  Father lives in [place] and has his own restaurant.  Patient usually go to [place] to visit father and for past year and a half was working there.  Patient also very close to his grandfather who lives in [place].  Patient currently single but he had a good social  network.  Allergies:   Allergies  Allergen Reactions   Shellfish Allergy Anaphylaxis   Shrimp (Diagnostic) Anaphylaxis and Itching   Watermelon [Citrullus Vulgaris] Itching and Swelling   Sulfamethoxazole-Trimethoprim Rash    Metabolic Disorder Labs: No results found for: "HGBA1C", "MPG" No results found for: "PROLACTIN" No results found for: "CHOL", "TRIG", "HDL", "CHOLHDL", "VLDL", "LDLCALC" Lab Results  Component Value Date   TSH 1.86 05/26/2021    Therapeutic Level Labs: No results found for: "LITHIUM" No results found for: "CBMZ" No results found for: "VALPROATE"  Current Medications: Current Outpatient Medications  Medication Sig Dispense Refill   amphetamine-dextroamphetamine (ADDERALL) 10 MG tablet Take 1 tablet (10 mg total) by mouth 2 (two) times daily with a meal. 60 tablet 0   divalproex (DEPAKOTE ER) 500 MG 24 hr tablet TAKE 1 TABLET (500 MG TOTAL) BY MOUTH DAILY. 30 tablet 0   naltrexone (DEPADE) 50 MG tablet Take 1 tablet (50 mg total) by mouth daily. bridge 15 tablet 0   omeprazole (PRILOSEC) 10 MG capsule Take 10 mg by mouth daily.     propranolol (INDERAL) 20 MG tablet Take 1 tablet (20 mg total) by mouth 2 (two) times daily as needed (anxiety). 30 tablet 0   venlafaxine XR (EFFEXOR XR) 75 MG 24 hr capsule Take 2 capsules (150 mg total) by mouth daily for 5 days, THEN 1 capsule (75 mg total) daily for 5 days. 15 capsule 0   No current facility-administered medications for this visit.    Musculoskeletal: Strength & Muscle Tone: within normal limits Gait & Station: normal Patient leans: N/A  Psychiatric Specialty Exam: Review of Systems  Musculoskeletal:        Left hand pain    Weight 187 lb (84.8 kg).Body mass index is 26.08 kg/m.  General Appearance: Fairly Groomed  Eye Contact:  Fair  Speech:  Slow  Volume:  Decreased  Mood:  Anxious  Affect:  Congruent  Thought Process:  Descriptions of Associations: Intact  Orientation:  Full (Time,  Place, and Person)  Thought Content:  Rumination  Suicidal Thoughts:  No  Homicidal Thoughts:  No  Memory:  Immediate;   Good Recent;   Fair Remote;   Fair  Judgement:  Fair  Insight:  Shallow  Psychomotor Activity:  Decreased  Concentration:  Concentration: Fair and Attention Span: Fair  Recall:  [employer] of Knowledge:Good  Language: Good  Akathisia:  No  Handed:  Right  AIMS (if indicated):  not done  Assets:  Communication Skills Desire for Improvement Housing Social Support Transportation  ADL's:  Intact  Cognition: WNL  Sleep:  Fair   Screenings: AIMS    Flowsheet Row ED to Hosp-Admission (Discharged) from 12/15/2021 in [facility]  AIMS Total Score 0      PHQ2-9    Flowsheet Row Video Visit [facility]/23/2022 in [facility] Video Visit from 02/12/2022 in [facility] Video Visit from 10/30/2021 in [facility] Video Visit from 09/11/2021 in [facility] Video Visit from 08/07/2021 in [facility]  PHQ-2 Total Score 1 1 4 4 2   PHQ-9 Total Score -- -- 15 8 9       Flowsheet Row Video Visit [facility] 04/23/2022 in [facility] Video Visit from 02/12/2022 in [facility] ED to Hosp-Admission (Discharged) from 12/15/2021 in [facility]  C-SSRS RISK CATEGORY No Risk Error: Q3, 4, or 5 should not be populated when Q2 is No No Risk       Assessment and Plan: I reviewed notes, current medication, collateral from previous providers.  He was cutting down his venlafaxine as per Dr. Michae Kava as belief causing anger but stopping the Effexor makes him more irritable and angry.  He is back on Effexor 75 mg daily, originally prescribed 225 mg.  He is also  taking naltrexone 50 mg daily, Depakote 500 mg at bedtime and propranolol prescribed 20 mg twice a day but he usually take once a day.  I discussed in detail about psychotropic medication, interfere with alcohol and drug use.  Patient is aware that stimulant cannot be prescribed unless he stopped drugs and alcohol.  Currently he is not working but hoping to go back to work and will need a stimulant but realizes he need to stop drinking.  Discussed safety concerns and any time having active suicidal thoughts or homicidal thought any need to call 911 or go to local emergency room.  Prescription given 30 days with 1 additional refills.  Recommend to call us back if is any question, concern or if he feels worsening of the symptoms.  Follow-up in 6 weeks.  Collaboration of Care: Other provider involved in patient's care AEB notes are available in epic to review.  Patient/Guardian was advised Release of Information must be obtained prior to any record release in order to collaborate their care with an outside provider. Patient/Guardian was advised if they have not already done so to contact the registration department to sign all necessary forms in order for [place] to release information regarding their care.   Consent: Patient/Guardian gives verbal consent for treatment and assignment of benefits for services provided during this visit. Patient/Guardian expressed understanding and agreed to proceed.   Follow Up Instructions:    I discussed the assessment and treatment plan with the  patient. The patient was provided an opportunity to ask questions and all were answered. The patient agreed with the plan and demonstrated an understanding of the instructions.   The patient was advised to call back or seek an in-person evaluation if the symptoms worsen or if the condition fails to improve as anticipated.  I provided 59 minutes of non-face-to-face time during this encounter.   Cleotis Nipper, MD 6/27/20243:21  PM

## 2022-10-16 ENCOUNTER — Other Ambulatory Visit (HOSPITAL_COMMUNITY): Payer: Self-pay | Admitting: Psychiatry

## 2022-10-16 DIAGNOSIS — F411 Generalized anxiety disorder: Secondary | ICD-10-CM

## 2022-11-16 ENCOUNTER — Other Ambulatory Visit (HOSPITAL_COMMUNITY): Payer: Self-pay | Admitting: Psychiatry

## 2022-11-16 DIAGNOSIS — F411 Generalized anxiety disorder: Secondary | ICD-10-CM

## 2022-11-16 DIAGNOSIS — F331 Major depressive disorder, recurrent, moderate: Secondary | ICD-10-CM

## 2022-11-16 DIAGNOSIS — F1210 Cannabis abuse, uncomplicated: Secondary | ICD-10-CM

## 2022-11-20 ENCOUNTER — Other Ambulatory Visit (HOSPITAL_COMMUNITY): Payer: Self-pay | Admitting: Psychiatry

## 2022-11-20 DIAGNOSIS — F331 Major depressive disorder, recurrent, moderate: Secondary | ICD-10-CM

## 2022-11-20 DIAGNOSIS — F1210 Cannabis abuse, uncomplicated: Secondary | ICD-10-CM

## 2022-11-20 DIAGNOSIS — F411 Generalized anxiety disorder: Secondary | ICD-10-CM

## 2022-11-27 ENCOUNTER — Telehealth (HOSPITAL_COMMUNITY): Payer: Managed Care, Other (non HMO) | Admitting: Psychiatry

## 2022-12-11 ENCOUNTER — Other Ambulatory Visit (HOSPITAL_COMMUNITY): Payer: Self-pay | Admitting: Psychiatry

## 2022-12-11 DIAGNOSIS — F411 Generalized anxiety disorder: Secondary | ICD-10-CM
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
## 2023-09-14 ENCOUNTER — Encounter: Payer: Self-pay | Admitting: Family Medicine

## 2023-09-15 ENCOUNTER — Other Ambulatory Visit: Payer: Self-pay | Admitting: Family Medicine

## 2023-09-15 MED ORDER — TRIAMCINOLONE ACETONIDE 0.1 % EX CREA
1.0000 | TOPICAL_CREAM | Freq: Two times a day (BID) | CUTANEOUS | 0 refills | Status: DC | PRN
Start: 2023-09-15 — End: 2023-09-27

## 2023-09-27 ENCOUNTER — Ambulatory Visit: Admitting: Family Medicine

## 2023-09-27 ENCOUNTER — Encounter: Payer: Self-pay | Admitting: Family Medicine

## 2023-09-27 VITALS — BP 110/60 | HR 95 | Temp 98.0°F | Ht 71.0 in | Wt 186.0 lb

## 2023-09-27 DIAGNOSIS — L237 Allergic contact dermatitis due to plants, except food: Secondary | ICD-10-CM | POA: Diagnosis not present

## 2023-09-27 DIAGNOSIS — F32A Depression, unspecified: Secondary | ICD-10-CM | POA: Diagnosis not present

## 2023-09-27 DIAGNOSIS — F419 Anxiety disorder, unspecified: Secondary | ICD-10-CM | POA: Diagnosis not present

## 2023-09-27 MED ORDER — EPINEPHRINE 0.3 MG/0.3ML IJ SOAJ: 0.3000 mg | INTRAMUSCULAR | 0 refills | Status: AC | PRN

## 2023-09-27 MED ORDER — PREDNISONE 10 MG PO TABS: ORAL_TABLET | ORAL | 0 refills | Status: DC

## 2023-09-27 NOTE — Progress Notes (Unsigned)
 He isn't smoking mariajuana.  He is still working in [place]  some of the time.   He went to a facility [place] of state, called the [facility].  He was there for ~28 days.  He is still in counseling.  D/w pt about PTSD dx.  No SI/HI.  He feels better in the meantime.  He is temporarily living with mother and step father.    D/w pt about psych f/u.  He is still on the meds below. He has an appointment pending.   Current Outpatient Medications on File Prior to Visit  Medication Sig Dispense Refill   divalproex  (DEPAKOTE  ER) 500 MG 24 hr tablet Take 1 tablet (500 mg total) by mouth daily. 30 tablet 1   escitalopram (LEXAPRO) 10 MG tablet Take 10 mg by mouth every morning.     prazosin (MINIPRESS) 1 MG capsule Take 1 mg by mouth at bedtime.     No current facility-administered medications on file prior to visit.   Poison ivy, now on the face, trunk and ext.  Itchy.  TAC didn't relieve the sx.  No feves.    Meds, vitals, and allergies reviewed.   ROS: Per HPI unless specifically indicated in ROS section

## 2023-09-27 NOTE — Patient Instructions (Signed)
 Prednisone  with food.  If this isn't helping, then let me know.  Take care.  Glad to see you.

## 2023-09-29 DIAGNOSIS — L237 Allergic contact dermatitis due to plants, except food: Secondary | ICD-10-CM | POA: Insufficient documentation

## 2023-09-29 NOTE — Assessment & Plan Note (Signed)
 Continue current meds with psych f/u pending.

## 2023-09-29 NOTE — Assessment & Plan Note (Signed)
 Doesn't appear infected.  Steroid cautions d/w pt. Prednisone  with food.  If this isn't helping, then let me know.

## 2023-10-01 ENCOUNTER — Other Ambulatory Visit: Payer: Self-pay | Admitting: Family Medicine

## 2023-10-01 MED ORDER — TRIAMCINOLONE ACETONIDE 0.1 % EX CREA: 1.0000 | TOPICAL_CREAM | Freq: Two times a day (BID) | CUTANEOUS | 1 refills | Status: DC | PRN

## 2023-11-29 ENCOUNTER — Ambulatory Visit (INDEPENDENT_AMBULATORY_CARE_PROVIDER_SITE_OTHER)
Admission: RE | Admit: 2023-11-29 | Discharge: 2023-11-29 | Disposition: A | Discharge status: Home / Self Care | Source: Ambulatory Visit | Attending: Family Medicine | Admitting: Family Medicine

## 2023-11-29 ENCOUNTER — Ambulatory Visit: Admitting: Family Medicine

## 2023-11-29 ENCOUNTER — Encounter: Payer: Self-pay | Admitting: Family Medicine

## 2023-11-29 VITALS — BP 118/76 | HR 88 | Temp 98.5°F | Ht 71.0 in | Wt 195.0 lb

## 2023-11-29 DIAGNOSIS — M25522 Pain in left elbow: Secondary | ICD-10-CM

## 2023-11-29 DIAGNOSIS — M25529 Pain in unspecified elbow: Secondary | ICD-10-CM | POA: Diagnosis not present

## 2023-11-29 MED ORDER — DICLOFENAC SODIUM 1 % EX GEL: 2.0000 g | Freq: Four times a day (QID) | CUTANEOUS | Status: AC | PRN

## 2023-11-29 MED ORDER — DIVALPROEX SODIUM 500 MG PO DR TAB: 500.0000 mg | DELAYED_RELEASE_TABLET | Freq: Two times a day (BID) | ORAL | Status: AC

## 2023-11-29 NOTE — Progress Notes (Signed)
 Fell PM on 11/27/23.  L arm sore.  He let the dog out, slipped on his heel (ground was slick), feet went out in front of him and fell on back and hit L elbow first.  No LOC.  Was able to get up.  Pain on ROM in the meantime.  Iced it in the meantime.  Normal grip.  Pain at the olecranon with flexion.    Meds, vitals, and allergies reviewed.   ROS: Per HPI unless specifically indicated in ROS section   Nad Ncat Normal L shoulder ROM.  Normal grip.   Normal elbow flexion but pain at the olecranon with flexion.  Medial and lateral olecranon not tender.  Antecubital fossa not tender. No bruising. Distally neurovascular intact.

## 2023-11-29 NOTE — Patient Instructions (Addendum)
 I don't see a fracture.  Use an elbow pad if needed.  You could try diclofenac gel if needed.   Ice for 5 minutes at a time.  Take care.  Glad to see you.

## 2023-12-01 DIAGNOSIS — M25529 Pain in unspecified elbow: Secondary | ICD-10-CM | POA: Insufficient documentation

## 2023-12-01 NOTE — Assessment & Plan Note (Signed)
 Discussed with patient about plain films.  I don't see a fracture.  Use an elbow pad if needed.  He could try diclofenac  gel if needed.   Ice for 5 minutes at a time.  Update me as needed.

## 2023-12-12 ENCOUNTER — Ambulatory Visit: Payer: Self-pay | Admitting: Family Medicine

## 2024-05-02 ENCOUNTER — Encounter: Payer: Self-pay | Admitting: Family Medicine

## 2024-05-05 ENCOUNTER — Encounter: Payer: Self-pay | Admitting: General Practice

## 2024-05-05 ENCOUNTER — Ambulatory Visit: Admitting: General Practice

## 2024-05-05 VITALS — BP 122/84 | HR 86 | Temp 97.5°F | Ht 71.0 in | Wt 224.0 lb

## 2024-05-05 DIAGNOSIS — L237 Allergic contact dermatitis due to plants, except food: Secondary | ICD-10-CM | POA: Diagnosis not present

## 2024-05-05 MED ORDER — PREDNISONE 20 MG PO TABS: 40.0000 mg | ORAL_TABLET | Freq: Every day | ORAL | 0 refills | Status: AC

## 2024-05-05 MED ORDER — CALAMINE EX LOTN: 1.0000 | TOPICAL_LOTION | CUTANEOUS | 0 refills | Status: AC | PRN

## 2024-05-05 NOTE — Progress Notes (Signed)
 "  Established Patient Office Visit  Subjective   Patient ID: Ian Lamb, male    DOB: [birth year]  Age: 24 y.o. MRN: [id number]  Chief Complaint  Patient presents with   posion ivy    Patient has ivy on his wrists, arms, feet, legs and waist since last week. Patient has been applying calamine lotion     HPI  Ian Lamb is a 24 year old male, patient of Loreli Solian, MD presents today for an acute visit.   Discussed the use of AI scribe software for clinical note transcription with the patient, who gave verbal consent to proceed.  History of Present Illness Ian Lamb is a 24 year old male who presents with a poison ivy rash.  The rash began last week after he was helping his grandfather by removing ivy from the window area. He notes that the gloves he was wearing had holes, which allowed the rash to spread from his wrist to other parts of his body, including his arms, feet, legs, and waist.  He reports that the rash is very itchy and that he has been using over-the-counter Calamine lotion, which he ran out of and found not very effective. He had a similar episode last year and was treated with prednisone  at that time and was given epi pen. He has not had to use the epi pen.   No breathing problems, shortness of breath, or chest pain.   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    Patient Active Problem List   Diagnosis Date Noted   Elbow pain 12/01/2023   Poison ivy 09/29/2023   Anxiety and depression 12/31/2016   Syncope 05/16/2013   Attention deficit disorder 11/28/2009   Past Medical History:  Diagnosis Date   Abscess 2011   Left calf   ADD (attention deficit disorder)    Anxiety    Depression    Family history of adverse reaction to anesthesia    Past Surgical History:  Procedure Laterality Date   MOUTH SURGERY     in childhood   NERVE AND TENDON REPAIR Right 12/15/2021    Procedure: RIGHT HAND TENDON REPAIR;  Surgeon: Sebastian Lenis, MD;  Location: [facility] OR;  Service: Orthopedics;  Laterality: Right;   NO PAST SURGERIES     TYMPANOSTOMY TUBE PLACEMENT Bilateral    Allergies[1]       05/05/2024   10:52 AM 11/29/2023    2:48 PM 09/27/2023    9:58 AM  Depression screen PHQ 2/9  Decreased Interest 2 0 0  Down, Depressed, Hopeless 1 2 2   PHQ - 2 Score 3 2 2   Altered sleeping 2 1 1   Tired, decreased energy 0 1 0  Change in appetite 3 0 0  Feeling bad or failure about yourself  1 2 0  Trouble concentrating 3 2 1   Moving slowly or fidgety/restless 1 1 1   Suicidal thoughts 0 0 0  PHQ-9 Score 13 9  5    Difficult doing work/chores Somewhat difficult Somewhat difficult Somewhat difficult     Data saved with a previous flowsheet row definition       05/05/2024   10:52 AM 11/29/2023    2:48 PM 09/27/2023    9:58 AM  GAD 7 : Generalized Anxiety Score  Nervous, Anxious, on Edge 2 1 3   Control/stop worrying 0 1 1  Worry too much - different things 1  3 1  Trouble relaxing 0 0 0  Restless 0 1 1  Easily annoyed or irritable 2 2 1   Afraid - awful might happen 0 1 2  Total GAD 7 Score 5 9 9   Anxiety Difficulty Somewhat difficult Not difficult at all Somewhat difficult      Review of Systems  Constitutional:  Negative for chills and fever.  Respiratory:  Negative for shortness of breath.   Cardiovascular:  Negative for chest pain.  Gastrointestinal:  Negative for abdominal pain, constipation, diarrhea, heartburn, nausea and vomiting.  Genitourinary:  Negative for dysuria, frequency and urgency.  Neurological:  Negative for dizziness and headaches.  Endo/Heme/Allergies:  Negative for polydipsia.  Psychiatric/Behavioral:  Negative for depression and suicidal ideas. The patient is not nervous/anxious.       Objective:     BP 122/84   Pulse 86   Temp (!) 97.5 F (36.4 C) (Temporal)   Ht 5' 11 (1.803 m)   Wt 224 lb (101.6 kg)   SpO2 98%   BMI 31.24  kg/m  BP Readings from Last 3 Encounters:  05/05/24 122/84  11/29/23 118/76  09/27/23 110/60   Wt Readings from Last 3 Encounters:  05/05/24 224 lb (101.6 kg)  11/29/23 195 lb (88.5 kg)  09/27/23 186 lb (84.4 kg)      Physical Exam Vitals and nursing note reviewed.  Constitutional:      Appearance: Normal appearance.  Cardiovascular:     Rate and Rhythm: Normal rate and regular rhythm.     Pulses: Normal pulses.     Heart sounds: Normal heart sounds.  Pulmonary:     Effort: Pulmonary effort is normal.     Breath sounds: Normal breath sounds.  Neurological:     Mental Status: He is alert and oriented to person, place, and time.  Psychiatric:        Mood and Affect: Mood normal.        Behavior: Behavior normal.        Thought Content: Thought content normal.        Judgment: Judgment normal.      No results found for any visits on 05/05/24.     The ASCVD Risk score (Arnett DK, et al., 2019) failed to calculate for the following reasons:   The 2019 ASCVD risk score is only valid for ages 61 to 49   * - Cholesterol units were assumed    Assessment & Plan:  Poison ivy -     predniSONE ; Take 2 tablets (40 mg total) by mouth daily for 5 days.  Dispense: 10 tablet; Refill: 0 -     SM Calamine; Apply 1 Application topically as needed for itching.  Dispense: 177 mL; Refill: 0    Assessment and Plan Assessment & Plan Allergic contact dermatitis due to poison ivy Allergic contact dermatitis from poison ivy exposure with itching, unresponsive to calamine lotion. - Prescribed prednisone  20 mg, two tablets in the morning for five days. - Advised morning dosing of prednisone  to prevent sleep disturbances. - Discussed prednisone  side effects: increased appetite, mood changes. - Advised avoiding alcohol during prednisone  course. - Sent prescription for calamine lotion to pharmacy.   Return if symptoms worsen or fail to improve.    Carrol Aurora, NP    [1]   Allergies Allergen Reactions   Shellfish Allergy Anaphylaxis   Shrimp (Diagnostic) Anaphylaxis and Itching   Watermelon [Citrullus Vulgaris] Itching and Swelling   Sulfamethoxazole-Trimethoprim Rash   "

## 2024-05-05 NOTE — Patient Instructions (Addendum)
 Start prednisone  20 mg tablets. Take 2 tablets by mouth once daily in the morning for 5 days.  Continue Calamine lotion.   Let me know if it doesn't improve.   It was a pleasure to see you today!
